# Patient Record
Sex: Male | Born: 1945 | Race: White | Hispanic: No | Marital: Married
Health system: Midwestern US, Community
[De-identification: ages and names within clinical notes are randomized; demographics above are authoritative.]

---

## 2014-03-26 IMAGING — DX ESOPHAGRAM
5 series · 5 of 5 positions shown · non-contrast
Comparison: None

ESOPHAGRAM 03/26/2014 [DATE]:
CLINICAL INDICATION: Nausea and epigastric fullness x4 weeks

[AP (1 of 3)]
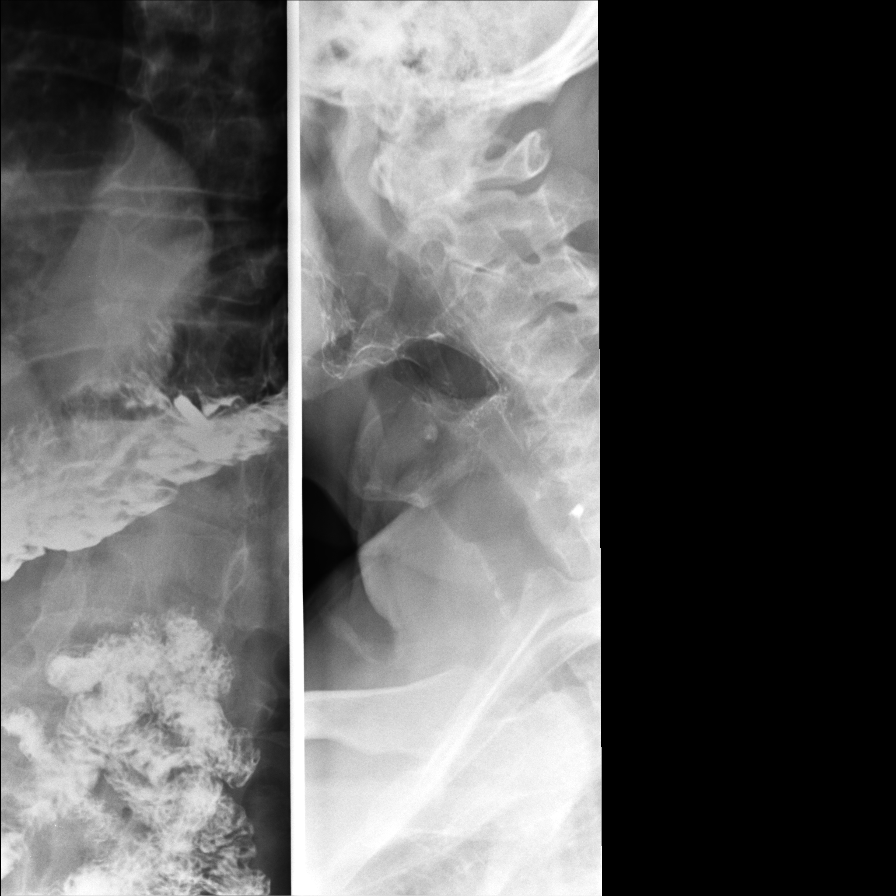

[AP (2 of 3)]
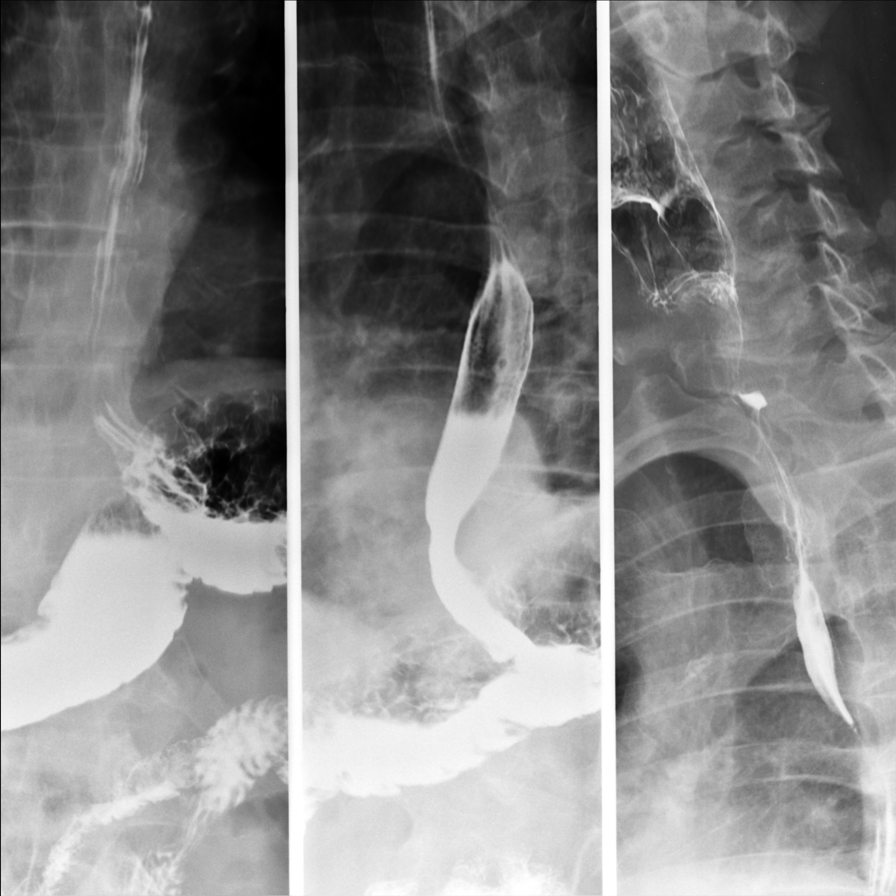

[rao]
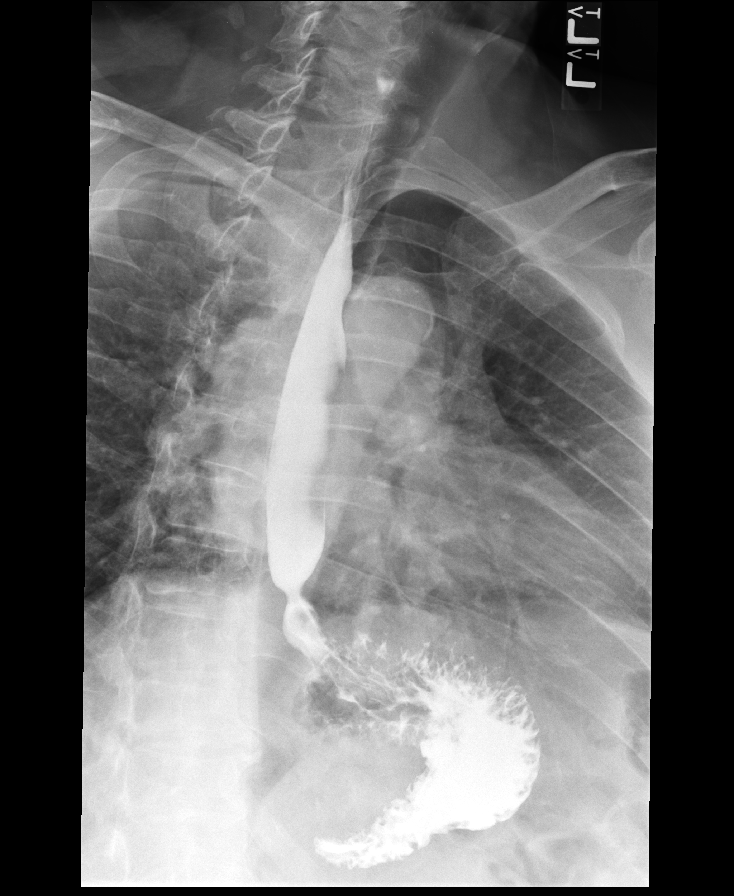

[AP (3 of 3)]
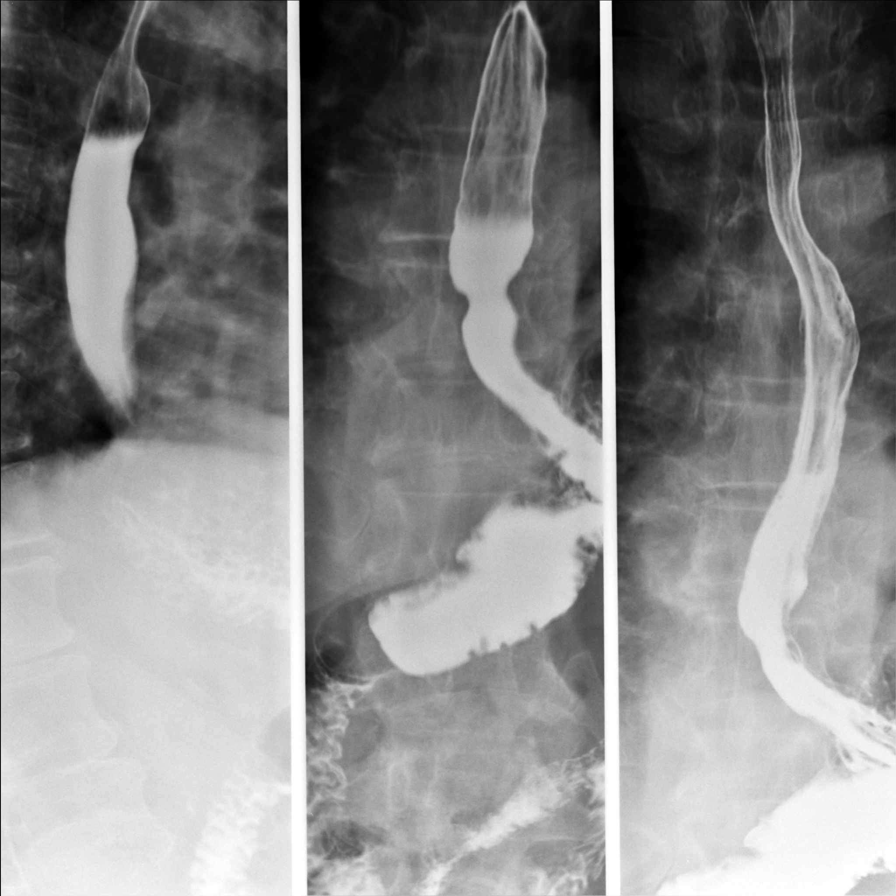

[lao]
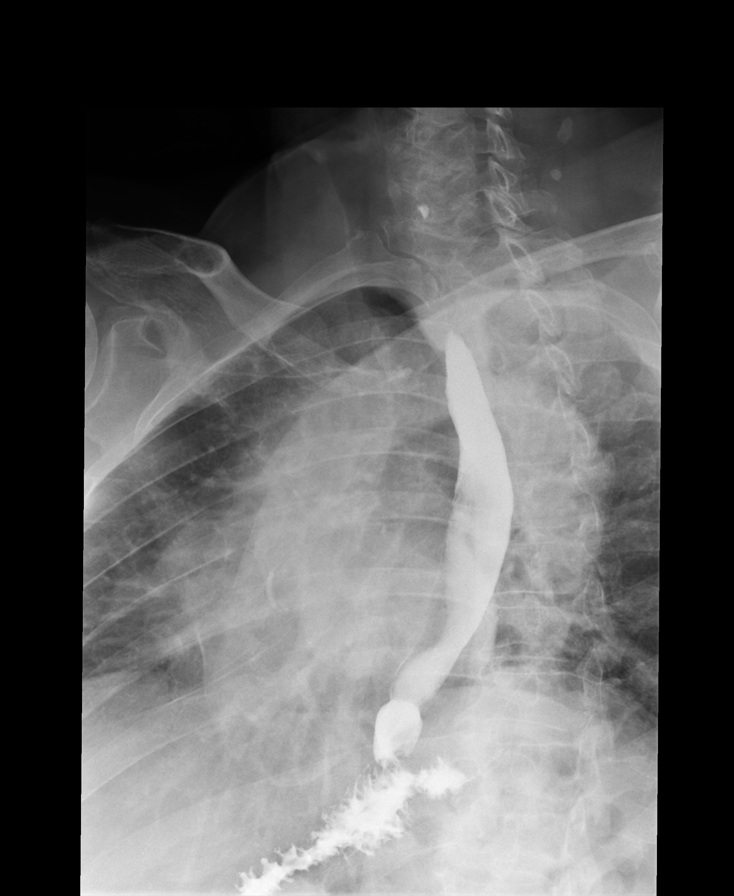

[5 of 5 positions shown; findings below may reference images not displayed]

FINDINGS: The patient drink thin and thick barium without difficulty, also 13
mm
barium tablet. There is no evidence for esophageal mass, stricture or
ulceration. There is no delay in initiating swallowing. There is a small
hiatal
hernia. No reflux elicited. There appears to be a tiny Jim diverticulum.
This measures less than 1 cm.
IMPRESSION: No evidence for stricture, mass or significant dysperistalsis.
Tiny Jim diverticulum, less than 1 cm.
Small hiatal hernia.

## 2014-03-26 IMAGING — DX CHEST PA AND LATERAL
2 series · 2 of 2 positions shown · non-contrast
Comparison: none

CLINICAL INDICATION:  Nausea and epigastric fullness x4 weeks
TECHNIQUE: PA and lateral radiographs of the chest.

[lateral]
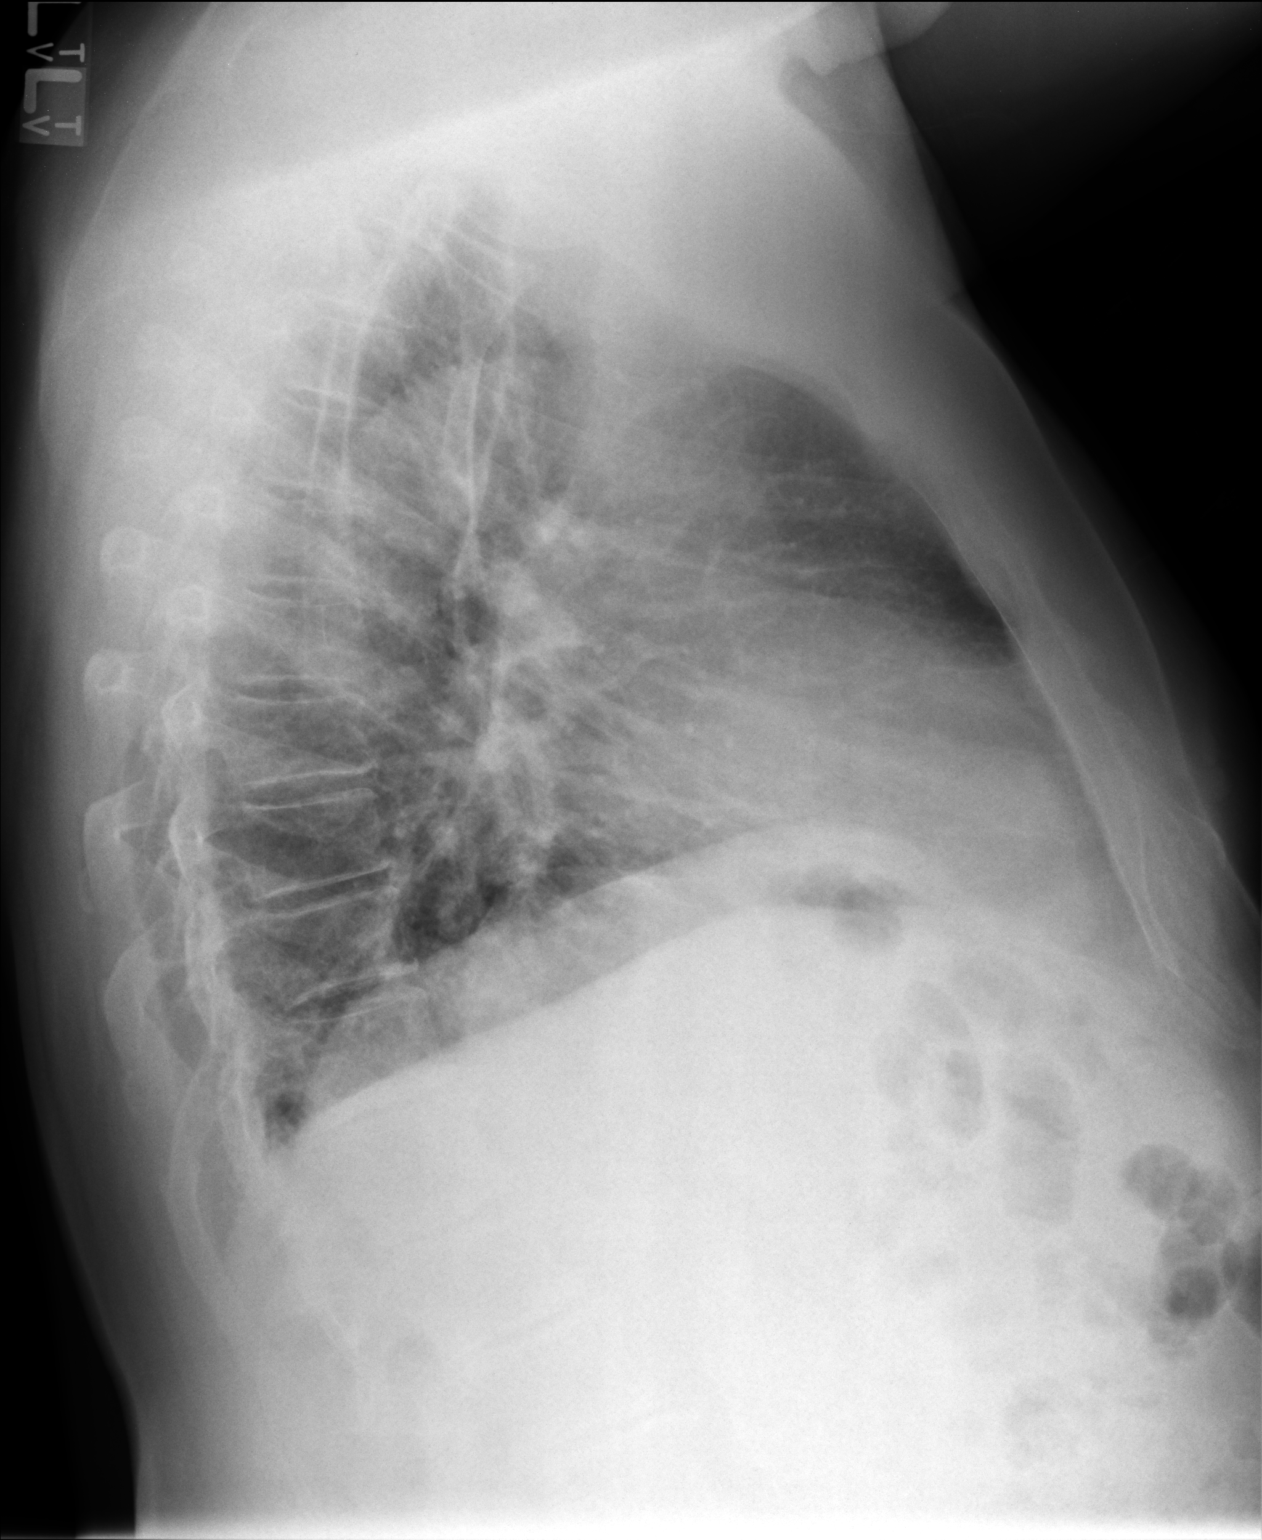

[PA]
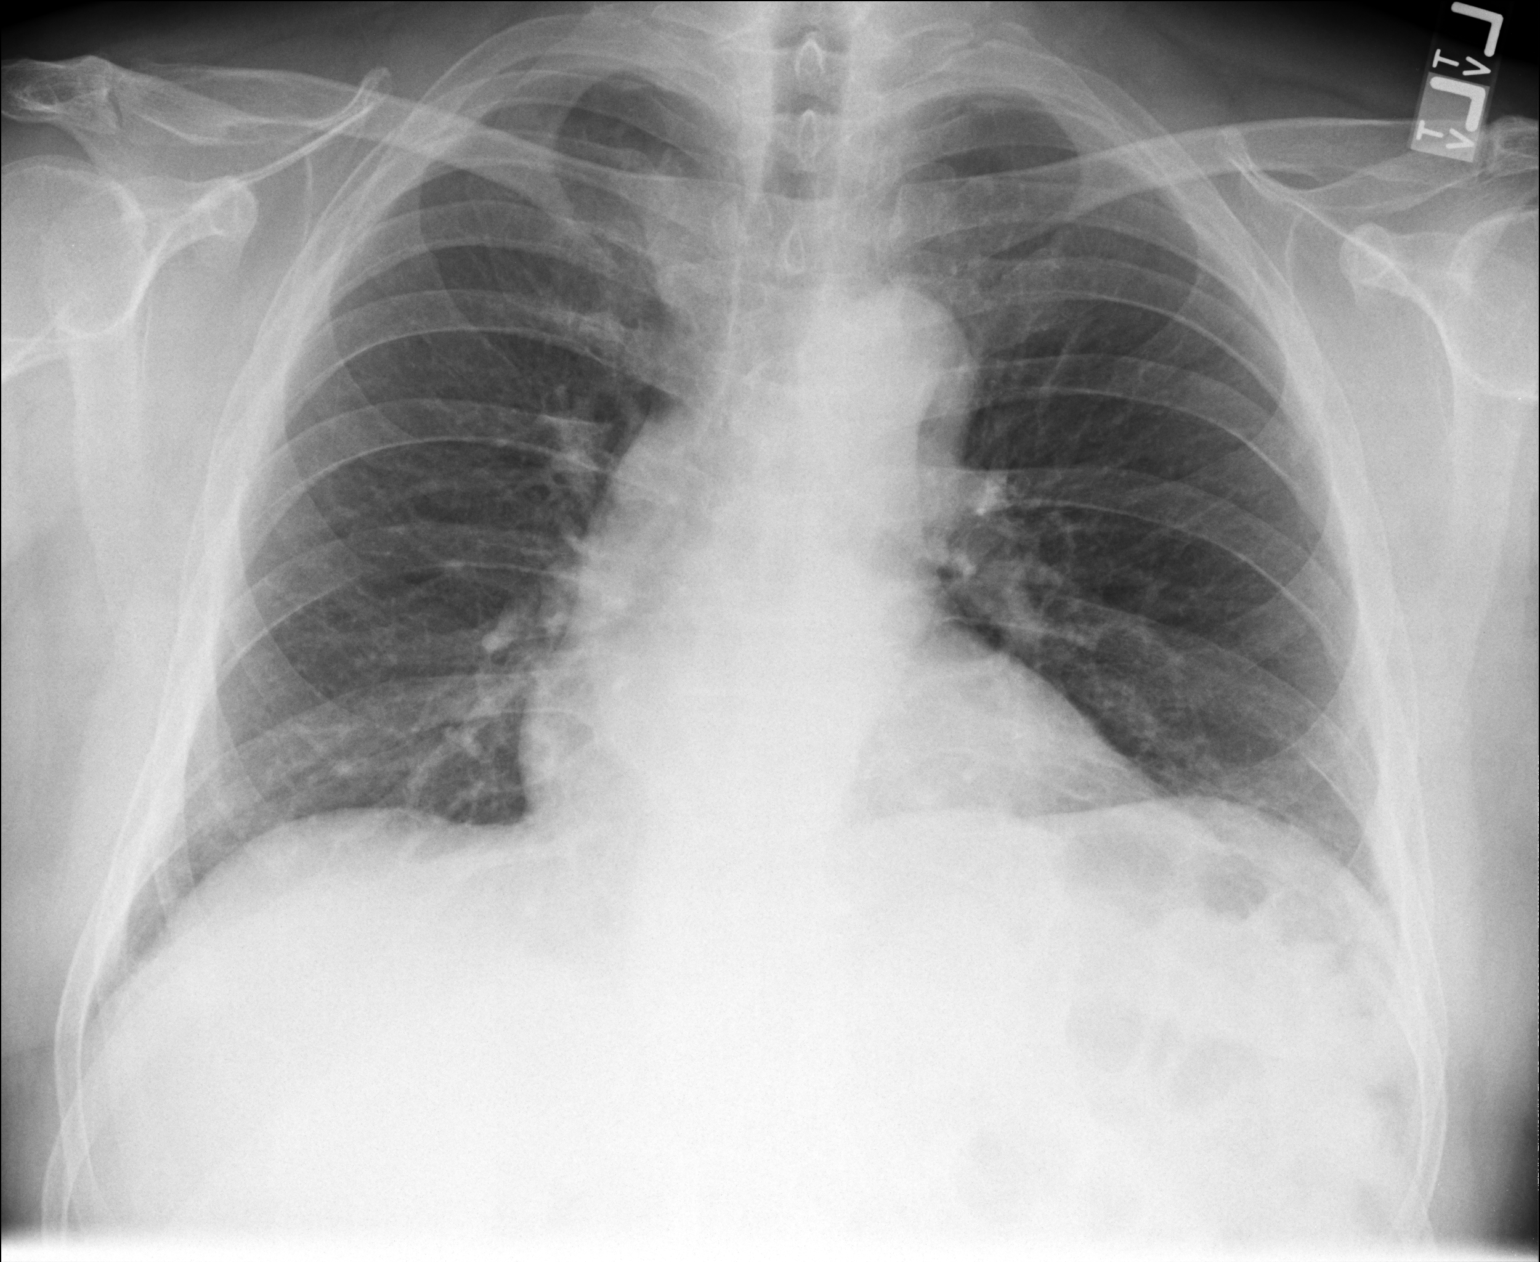

[2 of 2 positions shown; findings below may reference images not displayed]

FINDINGS: The lungs are well-expanded. There is minor atelectasis/fibrosis at
the left base. There is no effusion. The heart is not enlarged. The aorta is
tortuous. There are degenerative changes. There is no evidence for pulmonary
nodule.
IMPRESSION: Mild atelectasis/fibrosis at the left base. Otherwise no active
cardiopulmonary
findings.

## 2014-08-02 ENCOUNTER — Inpatient Hospital Stay: Admit: 2014-08-02

## 2014-08-02 LAB — CBC WITH AUTO DIFFERENTIAL
Absolute Eos #: 0.1 10*3/uL (ref 0.0–0.4)
Absolute Lymph #: 1.7 10*3/uL (ref 1.0–4.8)
Absolute Mono #: 0.4 10*3/uL (ref 0.2–0.8)
Basophils Absolute: 0 10*3/uL (ref 0.0–0.2)
Basophils: 1 % (ref 0–2)
Eosinophils %: 3 % (ref 1–4)
Hematocrit: 39.4 % — ABNORMAL LOW (ref 41–53)
Hemoglobin: 13.1 g/dL — ABNORMAL LOW (ref 13.5–17.5)
Lymphocytes: 36 % (ref 24–44)
MCH: 31.3 pg (ref 26–34)
MCHC: 33.3 g/dL (ref 31–37)
MCV: 93.8 fL (ref 80–100)
MPV: 9.4 fL (ref 6.0–12.0)
Monocytes: 8 % — ABNORMAL HIGH (ref 1–7)
Platelets: 149 10*3/uL (ref 130–400)
RBC: 4.2 m/uL — ABNORMAL LOW (ref 4.5–5.9)
RDW: 12.5 % (ref 11.5–14.5)
Seg Neutrophils: 52 % (ref 36–66)
Segs Absolute: 2.5 10*3/uL (ref 1.8–7.7)
WBC: 4.7 10*3/uL (ref 3.5–11.0)

## 2014-08-02 LAB — BASIC METABOLIC PANEL
Anion Gap: 12 mmol/L (ref 9–17)
BUN: 23 mg/dL (ref 8–23)
Bun/Cre Ratio: 24 — ABNORMAL HIGH (ref 9–20)
CO2: 26 mmol/L (ref 20–31)
Calcium: 8.8 mg/dL (ref 8.6–10.4)
Chloride: 104 mmol/L (ref 98–107)
Creatinine: 0.95 mg/dL (ref 0.70–1.20)
GFR African American: >60 mL/min (ref >60)
GFR Non-African American: >60 mL/min (ref >60)
Glucose: 125 mg/dL — ABNORMAL HIGH (ref 70–99)
Potassium: 4.4 mmol/L (ref 3.7–5.3)
Sodium: 142 mmol/L (ref 135–144)

## 2014-08-02 LAB — TYPE AND SCREEN
ABO/Rh: A POS
Antibody Screen: NEGATIVE

## 2014-08-02 NOTE — H&P (Signed)
History and Physical    Pt Name: Joshua Dorsey  MRN: [id number]  Birth date: [birth year]  Date of evaluation: 08/02/2014  Primary Care Physician: Otilio Saber, MD    SUBJECTIVE:   History of Chief Complaint:    This is Joshua Dorsey a 69 y.o. male who presents today with neck discomfort that radiates down right arm and left shoulder. + Numbness with deep aches, 6/10 on pain scale. Aggravating factors Prolonged walking/standing and sudden movements. Alleviating Factors denied. 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 Will be presenting for C 5 - C 6  C 6 - C 7 ACDF.     Past Medical History    has a past medical history of Arthritis; Claustrophobia; Depression; Diabetes mellitus (HCC); GERD (gastroesophageal reflux disease); Hyperlipidemia; Hypertension; OSA on CPAP; PTSD (post-traumatic stress disorder); and RLS (restless legs syndrome).     Past Surgical History   has a past surgical history that includes Cholecystectomy; back surgery (2009); and Tonsillectomy.     Medications  Prior to Admission medications    Medication Sig Start Date End Date Taking? Authorizing Provider   omeprazole (PRILOSEC) 20 MG capsule Take 20 mg by mouth Daily   Yes Historical Provider, MD   buPROPion (WELLBUTRIN) 100 MG tablet Take 200 mg by mouth daily   Yes Historical Provider, MD   hydrochlorothiazide (HYDRODIURIL) 25 MG tablet Take 25 mg by mouth every other day   Yes Historical Provider, MD   metoprolol (LOPRESSOR) 25 MG tablet Take 12.5 mg by mouth 2 times daily   Yes Historical Provider, MD   losartan (COZAAR) 100 MG tablet Take 100 mg by mouth daily   Yes Historical Provider, MD   citalopram (CELEXA) 20 MG tablet Take 20 mg by mouth daily   Yes Historical Provider, MD   atorvastatin (LIPITOR) 20 MG tablet Take 20 mg by mouth daily   Yes Historical Provider, MD   tamsulosin (FLOMAX) 0.4 MG capsule Take 0.4 mg by mouth daily   Yes Historical Provider, MD   fluticasone (FLONASE) 50 MCG/ACT nasal spray 2 sprays by Nasal route  daily   Yes Historical Provider, MD   prazosin (MINIPRESS) 2 MG capsule Take 2 mg by mouth nightly   Yes Historical Provider, MD   traZODone (DESYREL) 150 MG tablet Take 150 mg by mouth nightly   Yes Historical Provider, MD   loratadine (CLARITIN) 10 MG tablet Take 10 mg by mouth daily   Yes Historical Provider, MD   meloxicam (MOBIC) 15 MG tablet Take 15 mg by mouth daily   Yes Historical Provider, MD   metFORMIN (GLUCOPHAGE) 500 MG tablet Take 250 mg by mouth daily (with breakfast)   Yes Historical Provider, MD   Probiotic Product (PROBIOTIC COLON SUPPORT PO) Take by mouth daily   Yes Historical Provider, MD   iron polysaccaride (FERREX 150 FORTE PLUS) 50-100 MG CAPS Take 1 capsule by mouth daily   Yes Historical Provider, MD   ascorbic acid (VITAMIN C) 500 MG tablet Take 500 mg by mouth daily RLS   Yes Historical Provider, MD   Acetaminophen-Codeine (TYLENOL WITH CODEINE #3 PO) Take 1 tablet by mouth every 6 hours as needed   Yes Historical Provider, MD     Allergies  is allergic to oxycontin [oxycodone hcl].  Family History  History reviewed. No pertinent family history.   Social History  Social History     Social History   ??? Marital status: Married  Spouse name: N/A   ??? Number of children: N/A   ??? Years of education: N/A     Occupational History   ??? Not on file.     Social History Main Topics   ??? Smoking status: Former Smoker     Quit date: 08/02/1986   ??? Smokeless tobacco: Not on file   ??? Alcohol use 1.2 oz/week     2 Cans of beer per week   ??? Drug use: No   ??? Sexual activity: Not on file     Other Topics Concern   ??? Not on file     Social History Narrative   ??? No narrative on file       ROS: Pertinent findings in the HPI above.  A comprehensive review of systems was Negative. No LMP for male patient.  No obstetric history on file.    OBJECTIVE:   VITALS:  height is  (1.702 m) and weight is 200 lb 2.8 oz (90.8 kg). His blood pressure is 138/65 and his pulse is 56. His respiration is 16 and oxygen  saturation is 95%.     CONSTITUTIONAL:This is a 69 y.o. male who is cooperative, pleasant, alert & orientated x 3, and in no acute distress   SKIN:  Pink, warm and dry, no rashes   HEAD:  Normocephalic, atraumatic, face symmetrical  EYES: PERRL.  EOMs intact.  Cranial nerves II-XII grossly intact  EARS:  Hearing grossly WNL.    NOSE:  Nares patent.  No rhinorrhea   THROAT: Airway is patent, membranes are moist   NECK: Supple, trachea midline, no lymphadenopathy, no JVD, no bruits  LUNGS: Equal chest expansion and clear to auscultation bilaterally revealing no wheezes, rales or rhonchi. No use of accessory muscles.  CARDIOVASCULAR: Heart sounds are normal.  Regular rate and rhythm without murmur, gallop or rub.  ABDOMEN: Generally soft, nontender, nondistended, no masses or organomegaly, no hernias palpable, no guarding or peritoneal signs. Bowel sounds are present in all four quadrants.   EXTREMITIES: No upper/lower extremity cyanosis, clubbing or edema bilaterally, Intact distal pulses 3+/3+, no calf tenderness, Homans sign negative, strength 5+/5+. Body Position Antalgic. Mobility assisted with cane.    Testing:    EKG: 08/02/2014  Labs: No results found for: HGB, HCT, WBC, PLATELET, NA, K, CL, CO2, BUN, CREATININE, GLUCOSE, CALCIUM, AST, ALT, BILITOT, BILIDIR, AMMONIA, CRP, CKTOTAL, CKMB, HCG, HCGQUANT, PREGSERUM, PTT, INR    CBC: No results for input(s): WBC, HGB, PLT, SEGSPCT, SEGSABS, BANDSPCT, BLASTSPCT, LYMPHOPCT, EOSPCT, SEGSABS, MONOSABS, LYMPHSABS, EOSABS, BASOSABS, DIFFTYPE in the last 72 hours.    Invalid input(s): HCT3    BMP:  No results for input(s): NA, K, CL, CO2, BUN, CREATININE, GLUCOSE in the last 72 hours.    Invalid input(s): IBEGFR, IINBEGFR  Calcium:No results for input(s): CALCIUM in the last 72 hours.  Ionized Calcium:No results for input(s): IONCA in the last 72 hours.  Magnesium:No results for input(s): MG in the last 72 hours.  Phosphorus:No results for input(s): PHOS in the last 72  hours.  BNP:No results for input(s): BNP in the last 72 hours.  Glucose:No results for input(s): POCGLU in the last 72 hours.  HgbA1C: No results for input(s): LABA1C in the last 72 hours.  INR: No results for input(s): INR in the last 72 hours.  Hepatic: No results for input(s): ALKPHOS, ALT, AST, PROT, BILITOT, BILIDIR, LABALBU in the last 72 hours.  Amylase and Lipase:No results for input(s): LACTA, AMYLASE in the last  72 hours.  Lactic Acid: No results for input(s): LACTA in the last 72 hours.  Cardiac Enzymes:No results for input(s): CKTOTAL, CKMB, CKMBINDEX, TROPONINI in the last 72 hours.  BNP: No results for input(s): BNP in the last 72 hours.  Lipids: No results for input(s): CHOL, TRIG, HDL, LDLCALC in the last 72 hours.    Invalid input(s): LDL  ABGs: No results found for: PH, PCO2, PO2, HCO3, O2SAT  Thyroid: No results found for: TSH   Urinalysis:   No results for input(s): CLARITYU, COLORU, PHUR, SPECGRAV, PROTEINU, RBCUA, BLOODU, BACTERIA, NITRU, WBCUA, LEUKOCYTESUR, YEAST, GLUCOSEU, BILIRUBINUR in the last 72 hours.    IMPRESSIONS:   1.  C 5 - C 6  C 6 - C 7 Cervical Stenosis  2.  has a past medical history of Arthritis; Claustrophobia; Depression; Diabetes mellitus (HCC); GERD (gastroesophageal reflux disease); Hyperlipidemia; Hypertension; OSA on CPAP; PTSD (post-traumatic stress disorder); and RLS (restless legs syndrome).   PLANS:           C 5 - C 6  C 6 - C 7 ACDF       Isaia Hassell APRN, ANP-BC  Electronically signed 08/02/2014 at 9:50 AM

## 2014-08-02 NOTE — Patient Instructions (Signed)
PRE-OP INSTRUCTIONS      Date and time of surgery____1100___________________________________    Arrival time____________0900_______faq sheet and hibiclense with instructions given    Stop mobic   1. DO NOT EAT or DRINK ANYTHING after midnight the night before or morning of  surgery. This includes no water, chewing gum or mints.   2. Take the following pills with a small sip of water on the morning of surgery__metorprolol,omeprazole,bupropion,Tylenol #3,citalopram,losartan___________________________________________________   3. Aspirin, Ibuprofen, Advil, Vitamin E and other Anti-inflammatory products should be stopped for 5 days before surgery or as directed by your physician.   4. Check with your Doctor regarding stopping Plavix, Coumadin, or other blood thinners.              5. DO NOT inject insulin or take diabetic medications morning of surgery unless instructed per doctor.   6. DO NOT smoke, and do not drink any alcoholic beverages 24 hours prior to surgery.   7. You MUST make arrangements for a responsible adult to take you home after your surgery. You will not be allowed to leave alone or drive yourself home.  It is strongly suggested someone stay with you the first 24 hrs. Your surgery will be cancelled if you do not have a ride home.   8. Please wear simple, loose fitting clothing to the hospital.  Do not bring valuables (money, credit cards, checkbooks, etc.) Do not wear any makeup (including no eye makeup) or nail polish on your fingers or toes of operative extremity.   9. DO NOT wear any jewelry or piercings on day of surgery.  All body piercing jewelry must be removed.   10. If you have dentures, they will be removed before going to the OR; we will provide you a container.  If you wear contact lenses or glasses, they will be removed; please bring a case for them.   11. Remember to bring Blood Band bracelet to the hospital on the day of surgery if you received it in PAT.   12. Notify your Surgeon if  you develop any illness between now and surgery  time, cough, cold, fever, sore throat, nausea, vomiting, etc.  Please notify your surgeon if you experience dizziness, shortness of breath or blurred vision between now & the time of your surgery.      Other________________________________________________________________      *Please call Pre Admission Testing if you have any further questions.    [phone], Monday through Friday, 7:00am to 5:00pm.              I acknowledge receiving instructions as indicated above.                                                                            Patient/Rep_____________________________________________

## 2014-08-03 LAB — MRSA DNA PROBE, NASAL

## 2014-08-03 LAB — EKG 12-LEAD
Atrial Rate: 51 {beats}/min
P Axis: 61 degrees
P-R Interval: 162 ms
Q-T Interval: 442 ms
QRS Duration: 104 ms
QTc Calculation (Bazett): 407 ms
R Axis: 17 degrees
T Axis: 35 degrees
Ventricular Rate: 51 {beats}/min

## 2014-08-11 MED FILL — CEFAZOLIN SODIUM-DEXTROSE 2-4 GM/100ML-% IV SOLN: 2-4 GM/100ML-% | INTRAVENOUS | Qty: 2

## 2014-08-12 ENCOUNTER — Inpatient Hospital Stay: Admit: 2014-08-12

## 2014-08-12 ENCOUNTER — Encounter: Admit: 2014-08-12 | Discharge: 2014-08-13 | Disposition: A | Discharge status: Home / Self Care

## 2014-08-12 ENCOUNTER — Inpatient Hospital Stay: Admit: 2014-08-12 | Discharge: 2014-09-09 | Payer: MEDICARE

## 2014-08-12 LAB — POC GLUCOSE FINGERSTICK: POC Glucose: 103 mg/dL (ref 75–110)

## 2014-08-12 MED ORDER — TRAZODONE HCL 50 MG PO TABS
50 MG | Freq: Every evening | ORAL | Status: DC
Start: 2014-08-12 — End: 2014-08-13
  Administered 2014-08-13: 02:00:00 150 mg via ORAL

## 2014-08-12 MED ORDER — HYDROCHLOROTHIAZIDE 25 MG PO TABS
25 MG | ORAL | Status: DC
Start: 2014-08-12 — End: 2014-08-12

## 2014-08-12 MED ORDER — DOCUSATE SODIUM 100 MG PO CAPS
100 MG | Freq: Two times a day (BID) | ORAL | Status: DC
Start: 2014-08-12 — End: 2014-08-13
  Administered 2014-08-13 (×2): 100 mg via ORAL

## 2014-08-12 MED ORDER — OXYCODONE-ACETAMINOPHEN 5-325 MG PO TABS
5-325 MG | ORAL | Status: DC | PRN
Start: 2014-08-12 — End: 2014-08-13

## 2014-08-12 MED ORDER — LOSARTAN POTASSIUM 100 MG PO TABS
100 MG | Freq: Every day | ORAL | Status: DC
Start: 2014-08-12 — End: 2014-08-13
  Administered 2014-08-13: 12:00:00 100 mg via ORAL

## 2014-08-12 MED ORDER — NORMAL SALINE FLUSH 0.9 % IV SOLN
0.9 % | Freq: Two times a day (BID) | INTRAVENOUS | Status: DC
Start: 2014-08-12 — End: 2014-08-13

## 2014-08-12 MED ORDER — CITALOPRAM HYDROBROMIDE 20 MG PO TABS
20 MG | Freq: Every day | ORAL | Status: DC
Start: 2014-08-12 — End: 2014-08-13
  Administered 2014-08-13: 12:00:00 20 mg via ORAL

## 2014-08-12 MED ORDER — MEPERIDINE HCL 25 MG/ML IJ SOLN
25 MG/ML | INTRAMUSCULAR | Status: DC | PRN
Start: 2014-08-12 — End: 2014-08-12

## 2014-08-12 MED ORDER — DEXAMETHASONE SODIUM PHOSPHATE 10 MG/ML IJ SOLN
10 MG/ML | INTRAMUSCULAR | Status: AC
Start: 2014-08-12 — End: ?

## 2014-08-12 MED ORDER — PROPOFOL 200 MG/20ML IV EMUL
200 MG/20ML | INTRAVENOUS | Status: AC
Start: 2014-08-12 — End: ?

## 2014-08-12 MED ORDER — HYDROMORPHONE HCL 2 MG/ML IJ SOLN
2 MG/ML | INTRAMUSCULAR | Status: DC | PRN
Start: 2014-08-12 — End: 2014-08-12

## 2014-08-12 MED ORDER — LIDOCAINE-EPINEPHRINE 0.5 %-1:200000 IJ SOLN
0.5 %-1:200000 | INTRAMUSCULAR | Status: AC
Start: 2014-08-12 — End: ?

## 2014-08-12 MED ORDER — LACTATED RINGERS IV SOLN
INTRAVENOUS | Status: DC
Start: 2014-08-12 — End: 2014-08-13
  Administered 2014-08-13 (×2): via INTRAVENOUS

## 2014-08-12 MED ORDER — ONDANSETRON HCL 4 MG/2ML IJ SOLN
4 MG/2ML | Freq: Once | INTRAMUSCULAR | Status: DC | PRN
Start: 2014-08-12 — End: 2014-08-12

## 2014-08-12 MED ORDER — FERREX 150 FORTE PLUS 50-100 MG PO CAPS
50-100 MG | Freq: Every day | ORAL | Status: DC
Start: 2014-08-12 — End: 2014-08-13
  Administered 2014-08-13: 02:00:00 1 via ORAL

## 2014-08-12 MED ORDER — METHYLENE BLUE 1 % IJ SOLN
1 % | INTRAMUSCULAR | Status: AC
Start: 2014-08-12 — End: ?

## 2014-08-12 MED ORDER — METOPROLOL TARTRATE 25 MG PO TABS
25 MG | Freq: Two times a day (BID) | ORAL | Status: DC
Start: 2014-08-12 — End: 2014-08-13
  Administered 2014-08-13 (×2): 12.5 mg via ORAL

## 2014-08-12 MED ORDER — GENTAMICIN SULFATE 0.3 % OP OINT
0.3 % | Freq: Two times a day (BID) | OPHTHALMIC | Status: DC
Start: 2014-08-12 — End: 2014-08-13
  Administered 2014-08-12: 21:00:00 0.5 [in_us] via OPHTHALMIC

## 2014-08-12 MED ORDER — ATORVASTATIN CALCIUM 20 MG PO TABS
20 MG | Freq: Every evening | ORAL | Status: DC
Start: 2014-08-12 — End: 2014-08-13
  Administered 2014-08-13: 02:00:00 20 mg via ORAL

## 2014-08-12 MED ORDER — HYDRALAZINE HCL 20 MG/ML IJ SOLN
20 MG/ML | INTRAMUSCULAR | Status: DC | PRN
Start: 2014-08-12 — End: 2014-08-12

## 2014-08-12 MED ORDER — METFORMIN HCL 500 MG PO TABS
500 MG | Freq: Every day | ORAL | Status: DC
Start: 2014-08-12 — End: 2014-08-13
  Administered 2014-08-13: 12:00:00 250 mg via ORAL

## 2014-08-12 MED ORDER — TAMSULOSIN HCL 0.4 MG PO CAPS
0.4 MG | Freq: Every day | ORAL | Status: DC
Start: 2014-08-12 — End: 2014-08-13
  Administered 2014-08-13: 02:00:00 0.4 mg via ORAL

## 2014-08-12 MED ORDER — GLYCOPYRROLATE 1 MG/5ML IJ SOLN
1 MG/5ML | INTRAMUSCULAR | Status: AC
Start: 2014-08-12 — End: ?

## 2014-08-12 MED ORDER — PHENYLEPHRINE HCL 10 MG/ML IJ SOLN
10 MG/ML | INTRAMUSCULAR | Status: AC
Start: 2014-08-12 — End: ?

## 2014-08-12 MED ORDER — FENTANYL CITRATE (PF) 100 MCG/2ML IJ SOLN
100 MCG/2ML | INTRAMUSCULAR | Status: DC | PRN
Start: 2014-08-12 — End: 2014-08-12
  Administered 2014-08-12: 19:00:00 25 ug via INTRAVENOUS

## 2014-08-12 MED ORDER — MAGNESIUM HYDROXIDE 400 MG/5ML PO SUSP
400 MG/5ML | Freq: Every day | ORAL | Status: DC | PRN
Start: 2014-08-12 — End: 2014-08-13

## 2014-08-12 MED ORDER — DIPHENHYDRAMINE HCL 50 MG/ML IJ SOLN
50 MG/ML | Freq: Once | INTRAMUSCULAR | Status: DC | PRN
Start: 2014-08-12 — End: 2014-08-12

## 2014-08-12 MED ORDER — PRAZOSIN HCL 1 MG PO CAPS
1 MG | Freq: Every evening | ORAL | Status: DC
Start: 2014-08-12 — End: 2014-08-13

## 2014-08-12 MED ORDER — SODIUM CHLORIDE BACTERIOSTATIC 0.9 % IJ SOLN
0.9 % | INTRAMUSCULAR | Status: AC
Start: 2014-08-12 — End: ?

## 2014-08-12 MED ORDER — CETIRIZINE HCL 10 MG PO TABS
10 MG | Freq: Every day | ORAL | Status: DC
Start: 2014-08-12 — End: 2014-08-13
  Administered 2014-08-13: 12:00:00 10 mg via ORAL

## 2014-08-12 MED ORDER — FLUTICASONE PROPIONATE 50 MCG/ACT NA SUSP
50 MCG/ACT | Freq: Every day | NASAL | Status: DC
Start: 2014-08-12 — End: 2014-08-13
  Administered 2014-08-13: 02:00:00 2 via NASAL

## 2014-08-12 MED ORDER — ONDANSETRON HCL 4 MG/2ML IJ SOLN
4 MG/2ML | Freq: Four times a day (QID) | INTRAMUSCULAR | Status: DC | PRN
Start: 2014-08-12 — End: 2014-08-13

## 2014-08-12 MED ORDER — DEXAMETHASONE SODIUM PHOSPHATE 10 MG/ML IJ SOLN
10 MG/ML | Freq: Once | INTRAMUSCULAR | Status: DC | PRN
Start: 2014-08-12 — End: 2014-08-12

## 2014-08-12 MED ORDER — BUPROPION HCL 100 MG PO TABS
100 MG | Freq: Every day | ORAL | Status: DC
Start: 2014-08-12 — End: 2014-08-13
  Administered 2014-08-13: 12:00:00 200 mg via ORAL

## 2014-08-12 MED ORDER — HYDROCHLOROTHIAZIDE 25 MG PO TABS
25 MG | ORAL | Status: DC
Start: 2014-08-12 — End: 2014-08-13
  Administered 2014-08-13: 12:00:00 25 mg via ORAL

## 2014-08-12 MED ORDER — NORMAL SALINE FLUSH 0.9 % IV SOLN
0.9 % | INTRAVENOUS | Status: DC | PRN
Start: 2014-08-12 — End: 2014-08-13

## 2014-08-12 MED ORDER — MORPHINE SULFATE (PF) 2 MG/ML IV SOLN
2 MG/ML | INTRAVENOUS | Status: DC | PRN
Start: 2014-08-12 — End: 2014-08-13

## 2014-08-12 MED ORDER — OXYCODONE-ACETAMINOPHEN 5-325 MG PO TABS
5-325 MG | ORAL | Status: DC | PRN
Start: 2014-08-12 — End: 2014-08-13
  Administered 2014-08-12 – 2014-08-13 (×3): 2 via ORAL

## 2014-08-12 MED ORDER — LACTATED RINGERS IV SOLN
INTRAVENOUS | Status: DC
Start: 2014-08-12 — End: 2014-08-12
  Administered 2014-08-12 (×2): via INTRAVENOUS

## 2014-08-12 MED ORDER — LIDOCAINE HCL (PF) 2 % IJ SOLN
2 % | INTRAMUSCULAR | Status: AC
Start: 2014-08-12 — End: ?

## 2014-08-12 MED ORDER — MORPHINE SULFATE (PF) 4 MG/ML IV SOLN
4 MG/ML | INTRAVENOUS | Status: DC | PRN
Start: 2014-08-12 — End: 2014-08-13
  Administered 2014-08-13: 4 mg via INTRAVENOUS

## 2014-08-12 MED ORDER — CEFAZOLIN SODIUM-DEXTROSE 2-4 GM/100ML-% IV SOLN
2-4 GM/100ML-% | Freq: Once | INTRAVENOUS | Status: AC
Start: 2014-08-12 — End: 2014-08-12
  Administered 2014-08-12: 15:00:00 2 g via INTRAVENOUS

## 2014-08-12 MED ORDER — ONDANSETRON HCL 4 MG/2ML IJ SOLN
4 MG/2ML | INTRAMUSCULAR | Status: AC
Start: 2014-08-12 — End: ?

## 2014-08-12 MED ORDER — PANTOPRAZOLE SODIUM 40 MG PO TBEC
40 MG | Freq: Every day | ORAL | Status: DC
Start: 2014-08-12 — End: 2014-08-13
  Administered 2014-08-13: 10:00:00 40 mg via ORAL

## 2014-08-12 MED ORDER — NEOSTIGMINE METHYLSULFATE 1 MG/ML IJ SOLN
1 MG/ML | INTRAMUSCULAR | Status: AC
Start: 2014-08-12 — End: ?

## 2014-08-12 MED ORDER — NORMAL SALINE FLUSH 0.9 % IV SOLN
0.9 % | INTRAVENOUS | Status: DC | PRN
Start: 2014-08-12 — End: 2014-08-12

## 2014-08-12 MED ORDER — LABETALOL HCL 5 MG/ML IV SOLN
5 MG/ML | INTRAVENOUS | Status: DC | PRN
Start: 2014-08-12 — End: 2014-08-12

## 2014-08-12 MED ORDER — LIDOCAINE HCL (PF) 1 % IJ SOLN
1 % | Freq: Once | INTRAMUSCULAR | Status: DC | PRN
Start: 2014-08-12 — End: 2014-08-12

## 2014-08-12 MED ORDER — ACETAMINOPHEN 325 MG PO TABS
325 MG | ORAL | Status: DC | PRN
Start: 2014-08-12 — End: 2014-08-13

## 2014-08-12 MED ORDER — BACITRACIN 50000 UNITS IM SOLR
50000 units | INTRAMUSCULAR | Status: AC
Start: 2014-08-12 — End: ?

## 2014-08-12 MED ORDER — VITAMIN C 500 MG PO TABS
500 MG | Freq: Every evening | ORAL | Status: DC
Start: 2014-08-12 — End: 2014-08-13
  Administered 2014-08-13: 02:00:00 500 mg via ORAL

## 2014-08-12 MED FILL — SODIUM CHLORIDE BACTERIOSTATIC 0.9 % IJ SOLN: 0.9 % | INTRAMUSCULAR | Qty: 30

## 2014-08-12 MED FILL — BACITRACIN 50000 UNITS IM SOLR: 50000 units | INTRAMUSCULAR | Qty: 1

## 2014-08-12 MED FILL — GENTAK 0.3 % OP OINT: 0.3 % | OPHTHALMIC | Qty: 3.5

## 2014-08-12 MED FILL — FERREX 150 FORTE PLUS 50-100 MG PO CAPS: 50-100 MG | ORAL | Qty: 1

## 2014-08-12 MED FILL — BUPROPION HCL 100 MG PO TABS: 100 MG | ORAL | Qty: 2

## 2014-08-12 MED FILL — OXYCODONE-ACETAMINOPHEN 5-325 MG PO TABS: 5-325 MG | ORAL | Qty: 2

## 2014-08-12 MED FILL — FENTANYL CITRATE (PF) 100 MCG/2ML IJ SOLN: 100 MCG/2ML | INTRAMUSCULAR | Qty: 2

## 2014-08-12 MED FILL — TRAZODONE HCL 50 MG PO TABS: 50 MG | ORAL | Qty: 1

## 2014-08-12 MED FILL — ATORVASTATIN CALCIUM 20 MG PO TABS: 20 MG | ORAL | Qty: 1

## 2014-08-12 MED FILL — METHYLENE BLUE 1 % IJ SOLN: 1 % | INTRAMUSCULAR | Qty: 10

## 2014-08-12 MED FILL — PROPOFOL 200 MG/20ML IV EMUL: 200 MG/20ML | INTRAVENOUS | Qty: 20

## 2014-08-12 MED FILL — CETIRIZINE HCL 10 MG PO TABS: 10 MG | ORAL | Qty: 1

## 2014-08-12 MED FILL — GLYCOPYRROLATE 1 MG/5ML IJ SOLN: 1 MG/5ML | INTRAMUSCULAR | Qty: 5

## 2014-08-12 MED FILL — CITALOPRAM HYDROBROMIDE 20 MG PO TABS: 20 MG | ORAL | Qty: 1

## 2014-08-12 MED FILL — LIDOCAINE-EPINEPHRINE 0.5 %-1:200000 IJ SOLN: 0.5 %-1:200000 | INTRAMUSCULAR | Qty: 50

## 2014-08-12 MED FILL — NEOSTIGMINE METHYLSULFATE 1 MG/ML IJ SOLN: 1 MG/ML | INTRAMUSCULAR | Qty: 10

## 2014-08-12 MED FILL — ZEMURON 50 MG/5ML IV SOLN: 50 MG/5ML | INTRAVENOUS | Qty: 5

## 2014-08-12 MED FILL — FENTANYL CITRATE (PF) 250 MCG/5ML IJ SOLN: 250 MCG/5ML | INTRAMUSCULAR | Qty: 5

## 2014-08-12 MED FILL — ONDANSETRON HCL 4 MG/2ML IJ SOLN: 4 MG/2ML | INTRAMUSCULAR | Qty: 2

## 2014-08-12 MED FILL — FLUTICASONE PROPIONATE 50 MCG/ACT NA SUSP: 50 MCG/ACT | NASAL | Qty: 16

## 2014-08-12 MED FILL — PHENYLEPHRINE HCL 10 MG/ML IJ SOLN: 10 MG/ML | INTRAMUSCULAR | Qty: 1

## 2014-08-12 MED FILL — MIDAZOLAM HCL 2 MG/2ML IJ SOLN: 2 MG/ML | INTRAMUSCULAR | Qty: 2

## 2014-08-12 MED FILL — VITAMIN C 500 MG PO TABS: 500 MG | ORAL | Qty: 1

## 2014-08-12 MED FILL — DEXAMETHASONE SODIUM PHOSPHATE 10 MG/ML IJ SOLN: 10 MG/ML | INTRAMUSCULAR | Qty: 1

## 2014-08-12 MED FILL — EPHEDRINE SULFATE 50 MG/ML IJ SOLN: 50 MG/ML | INTRAMUSCULAR | Qty: 1

## 2014-08-12 MED FILL — PRAZOSIN HCL 1 MG PO CAPS: 1 MG | ORAL | Qty: 2

## 2014-08-12 MED FILL — MORPHINE SULFATE (PF) 4 MG/ML IV SOLN: 4 mg/mL | INTRAVENOUS | Qty: 1

## 2014-08-12 MED FILL — XYLOCAINE-MPF 2 % IJ SOLN: 2 % | INTRAMUSCULAR | Qty: 5

## 2014-08-12 NOTE — Other (Addendum)
Patient Acct Nbr:  [id number]  Primary AUTH/CERT:    Primary Insurance Company Name:   MEDICARE  Primary Insurance Plan Name:    Primary Insurance Group Number:    [facility]  Primary Insurance Policy Number:  [id number]    Secondary AUTH/CERT:    Secondary Insurance Company Name:   [facility]  Secondary Insurance Plan Name:    Secondary Insurance Group Number:    Secondary Insurance Plan Type: [employer]  Secondary Insurance Policy Number:  [id number]

## 2014-08-12 NOTE — Progress Notes (Addendum)
Called to see Pt re: c/o Scratchy , tearing left eye on return from PACU.Marland Kitchen.   Exam shows reddened, tearing left eye with some possible upper left conjunctival irritation.  No complaint of vision change.    Impression:  Minor conjunctival abrasion    Plan:  Patched eye with garamycin ointment.  Nurse may change dressing and apply ointment when dressing saturated.  Will keep in place until AM and will re-visit at that time.  Pt more comfortable now.

## 2014-08-12 NOTE — Anesthesia Post-Procedure Evaluation (Signed)
POST- ANESTHESIA EVALUATION       Pt Name: Joshua Dorsey  MRN: [id number]  Birthdate: 1945/08/18  Date of evaluation: 08/12/2014  Time:  3:28 PM      Visit Vitals   . BP 130/56   . Pulse 62   . Temp 96.8 F (36 C)   . Resp 12   . Ht 5\' 7"  (1.702 m)   . Wt 200 lb (90.7 kg)   . SpO2 92%   . BMI 31.32 kg/m2        Consciousness Level  Awake  Cardiopulmonary Status  Stable  Pain Adequately Treated YES  Nausea / Vomiting  NO  Adequate Hydration  YES  Anesthesia Related Complications NONE      Electronically signed by Trisha MangleMaamun X Koi Zangara, MD on 08/12/2014 at 3:28 PM    POST- ANESTHESIA EVALUATION       Pt Name: Joshua Dorsey  MRN: [id number]  Birthdate: 1945/08/18  Date of evaluation: 08/12/2014  Time:  3:28 PM      Visit Vitals   . BP 130/56   . Pulse 62   . Temp 96.8 F (36 C)   . Resp 12   . Ht 5\' 7"  (1.702 m)   . Wt 200 lb (90.7 kg)   . SpO2 92%   . BMI 31.32 kg/m2        Consciousness Level  Awake  Cardiopulmonary Status  Stable  Pain Adequately Treated YES  Nausea / Vomiting  NO  Adequate Hydration  YES  Anesthesia Related Complications NONE      Electronically signed by Trisha MangleMaamun X Salbador Fiveash, MD on 08/12/2014 at 3:28 PM

## 2014-08-12 NOTE — Plan of Care (Signed)
Problem: Falls - Risk of  Goal: Absence of falls  Outcome: Ongoing  Fall safety precautions in place

## 2014-08-12 NOTE — H&P (Signed)
History and Physical Update    Pt Name: Joshua Dorsey  MRN: [id number]  Birthdate: 1946/01/08  Date of evaluation: 08/12/2014       I have reviewed the H&P found in [facility] dated 08/01/2012 by Glendale Chard MSN CNP which meets the criteria for an Interval History and Physical note and is attached below.     I have examined  Joshua Dorsey and there are no changes to the patient or plans.    Vital signs:   Visit Vitals   ??? BP 133/64   ??? Pulse 51   ??? Temp 97.7 ??F (36.5 ??C) (Oral)   ??? Resp 18   ??? Ht  (1.702 m)   ??? Wt 200 lb (90.7 kg)   ??? SpO2 97%   ??? BMI 31.32 kg/m2       Allergies:  Oxycontin [oxycodone hcl]    Medications:   No current facility-administered medications on file prior to encounter.      Current Outpatient Prescriptions on File Prior to Encounter   Medication Sig Dispense Refill   ??? omeprazole (PRILOSEC) 20 MG capsule Take 20 mg by mouth Daily     ??? buPROPion (WELLBUTRIN) 100 MG tablet Take 200 mg by mouth daily     ??? hydrochlorothiazide (HYDRODIURIL) 25 MG tablet Take 25 mg by mouth every other day     ??? metoprolol (LOPRESSOR) 25 MG tablet Take 12.5 mg by mouth 2 times daily     ??? losartan (COZAAR) 100 MG tablet Take 100 mg by mouth daily     ??? citalopram (CELEXA) 20 MG tablet Take 20 mg by mouth daily     ??? atorvastatin (LIPITOR) 20 MG tablet Take 20 mg by mouth daily     ??? tamsulosin (FLOMAX) 0.4 MG capsule Take 0.4 mg by mouth daily     ??? fluticasone (FLONASE) 50 MCG/ACT nasal spray 2 sprays by Nasal route daily     ??? prazosin (MINIPRESS) 2 MG capsule Take 2 mg by mouth nightly     ??? traZODone (DESYREL) 150 MG tablet Take 150 mg by mouth nightly     ??? loratadine (CLARITIN) 10 MG tablet Take 10 mg by mouth daily     ??? meloxicam (MOBIC) 15 MG tablet Take 15 mg by mouth daily     ??? metFORMIN (GLUCOPHAGE) 500 MG tablet Take 250 mg by mouth daily (with breakfast)     ??? Probiotic Product (PROBIOTIC COLON SUPPORT PO) Take by mouth daily     ??? iron polysaccaride (FERREX 150 FORTE PLUS) 50-100 MG  CAPS Take 1 capsule by mouth daily     ??? ascorbic acid (VITAMIN C) 500 MG tablet Take 500 mg by mouth daily RLS     ??? Acetaminophen-Codeine (TYLENOL WITH CODEINE #3 PO) Take 1 tablet by mouth every 6 hours as needed              Prior to Admission medications    Medication Sig Start Date End Date Taking? Authorizing Provider   omeprazole (PRILOSEC) 20 MG capsule Take 20 mg by mouth Daily   Yes Historical Provider, MD   buPROPion (WELLBUTRIN) 100 MG tablet Take 200 mg by mouth daily   Yes Historical Provider, MD   hydrochlorothiazide (HYDRODIURIL) 25 MG tablet Take 25 mg by mouth every other day   Yes Historical Provider, MD   metoprolol (LOPRESSOR) 25 MG tablet Take 12.5 mg by mouth 2 times daily   Yes Historical Provider, MD   losartan (  COZAAR) 100 MG tablet Take 100 mg by mouth daily   Yes Historical Provider, MD   citalopram (CELEXA) 20 MG tablet Take 20 mg by mouth daily   Yes Historical Provider, MD   atorvastatin (LIPITOR) 20 MG tablet Take 20 mg by mouth daily   Yes Historical Provider, MD   tamsulosin (FLOMAX) 0.4 MG capsule Take 0.4 mg by mouth daily   Yes Historical Provider, MD   fluticasone (FLONASE) 50 MCG/ACT nasal spray 2 sprays by Nasal route daily   Yes Historical Provider, MD   prazosin (MINIPRESS) 2 MG capsule Take 2 mg by mouth nightly   Yes Historical Provider, MD   traZODone (DESYREL) 150 MG tablet Take 150 mg by mouth nightly   Yes Historical Provider, MD   loratadine (CLARITIN) 10 MG tablet Take 10 mg by mouth daily   Yes Historical Provider, MD   meloxicam (MOBIC) 15 MG tablet Take 15 mg by mouth daily   Yes Historical Provider, MD   metFORMIN (GLUCOPHAGE) 500 MG tablet Take 250 mg by mouth daily (with breakfast)   Yes Historical Provider, MD   Probiotic Product (PROBIOTIC COLON SUPPORT PO) Take by mouth daily   Yes Historical Provider, MD   iron polysaccaride (FERREX 150 FORTE PLUS) 50-100 MG CAPS Take 1 capsule by mouth daily   Yes Historical Provider, MD   ascorbic acid (VITAMIN C) 500 MG  tablet Take 500 mg by mouth daily RLS   Yes Historical Provider, MD   Acetaminophen-Codeine (TYLENOL WITH CODEINE #3 PO) Take 1 tablet by mouth every 6 hours as needed   Yes Historical Provider, MD       This is a 69 y.o. male who is pleasant, cooperative, alert and oriented x3, in no acute distress.    Heart: Heart sounds are normal.  Regular rate and rhythm without murmur, gallop or rub.   Lungs:clear to auscultation without wheezes or rales  No increased work of breathing, good air exchange, clear to auscultation bilaterally, no crackles or wheezing  Abdomen: soft, nontender, nondistended, no masses or organomegaly.   CONSTITUTIONAL:  awake, alert, cooperative, no apparent distress, and appears stated age    H&P  Date of Service: 08/02/2014 9:48 AM  Elberta Leatherwood, NP   Surgery   Cosigned by: Michiel Cowboy, MD at 08/09/2014 ??2:13 PM   Expand All Collapse All    [] Hide copied text  [] Hover for attribution information  History and Physical  ??  Pt Name: Joshua Dorsey  MRN: [id number]  Birth date: 1945-11-20  Date of evaluation: 08/02/2014  Primary Care Physician: Otilio Saber, MD  ??  SUBJECTIVE:   History of Chief Complaint:    This is [name] a 69 y.o. male who presents today with neck discomfort that radiates down right arm and left shoulder. + Numbness with deep aches, 6/10 on pain scale. Aggravating factors Prolonged walking/standing and sudden movements. Alleviating Factors denied. Dx C 5 - C 6 C 6 - C 7 Cervical Stenosis. Will be presenting for C 5 - C 6 C 6 - C 7 ACDF.   ??  Past Medical History    has a past medical history of Arthritis; Claustrophobia; Depression; Diabetes mellitus (HCC); GERD (gastroesophageal reflux disease); Hyperlipidemia; Hypertension; OSA on CPAP; PTSD (post-traumatic stress disorder); and RLS (restless legs syndrome).   ??  Past Surgical History   has a past surgical history that includes Cholecystectomy; back surgery (2009); and Tonsillectomy.   ??  Medications  Home??Medications            Prior to Admission medications    Medication Sig Start Date End Date Taking? Authorizing Provider   omeprazole (PRILOSEC) 20 MG capsule Take 20 mg by mouth Daily ?? ?? Yes Historical Provider, MD   buPROPion (WELLBUTRIN) 100 MG tablet Take 200 mg by mouth daily ?? ?? Yes Historical Provider, MD   hydrochlorothiazide (HYDRODIURIL) 25 MG tablet Take 25 mg by mouth every other day ?? ?? Yes Historical Provider, MD   metoprolol (LOPRESSOR) 25 MG tablet Take 12.5 mg by mouth 2 times daily ?? ?? Yes Historical Provider, MD   losartan (COZAAR) 100 MG tablet Take 100 mg by mouth daily ?? ?? Yes Historical Provider, MD   citalopram (CELEXA) 20 MG tablet Take 20 mg by mouth daily ?? ?? Yes Historical Provider, MD   atorvastatin (LIPITOR) 20 MG tablet Take 20 mg by mouth daily ?? ?? Yes Historical Provider, MD   tamsulosin (FLOMAX) 0.4 MG capsule Take 0.4 mg by mouth daily ?? ?? Yes Historical Provider, MD   fluticasone (FLONASE) 50 MCG/ACT nasal spray 2 sprays by Nasal route daily ?? ?? Yes Historical Provider, MD   prazosin (MINIPRESS) 2 MG capsule Take 2 mg by mouth nightly ?? ?? Yes Historical Provider, MD   traZODone (DESYREL) 150 MG tablet Take 150 mg by mouth nightly ?? ?? Yes Historical Provider, MD   loratadine (CLARITIN) 10 MG tablet Take 10 mg by mouth daily ?? ?? Yes Historical Provider, MD   meloxicam (MOBIC) 15 MG tablet Take 15 mg by mouth daily ?? ?? Yes Historical Provider, MD   metFORMIN (GLUCOPHAGE) 500 MG tablet Take 250 mg by mouth daily (with breakfast) ?? ?? Yes Historical Provider, MD   Probiotic Product (PROBIOTIC COLON SUPPORT PO) Take by mouth daily ?? ?? Yes Historical Provider, MD   iron polysaccaride (FERREX 150 FORTE PLUS) 50-100 MG CAPS Take 1 capsule by mouth daily ?? ?? Yes Historical Provider, MD   ascorbic acid (VITAMIN C) 500 MG tablet Take 500 mg by mouth daily RLS ?? ?? Yes Historical Provider, MD   Acetaminophen-Codeine (TYLENOL WITH CODEINE #3 PO) Take 1 tablet by mouth every 6 hours as  needed ?? ?? Yes Historical Provider, MD      ??  Allergies  is allergic to oxycontin [oxycodone hcl].  Family History   Family??History    History reviewed. No pertinent family history.      Social History   Social??History    Social History   ??        Social History   ??? Marital status: Married   ?? ?? Spouse name: N/A   ??? Number of children: N/A   ??? Years of education: N/A   ??  Occupational History   ??? Not on file.   ??         Social History Main Topics   ??? Smoking status: Former Smoker   ?? ?? Quit date: 08/02/1986   ??? Smokeless tobacco: Not on file   ??? Alcohol use 1.2 oz/week    ?? ?? 2 Cans of beer per week    ??? Drug use: No   ??? Sexual activity: Not on file   ??       Other Topics Concern   ??? Not on file   ??      Social History Narrative   ??? No narrative on file      ??  ??  ROS: Pertinent findings in the HPI above. A comprehensive review of systems was Negative. No LMP for male patient. No obstetric history on file.  ??  OBJECTIVE:   VITALS:  height is 5\' 7"  (1.702 m) and weight is 200 lb 2.8 oz (90.8 kg). His blood pressure is 138/65 and his pulse is 56. His respiration is 16 and oxygen saturation is 95%.   ??  CONSTITUTIONAL:This is a 69 y.o. male who is cooperative, pleasant, alert & orientated x 3, and in no acute distress   SKIN: Pink, warm and dry, no rashes   HEAD: Normocephalic, atraumatic, face symmetrical  EYES: PERRL. EOMs intact. Cranial nerves II-XII grossly intact  EARS: Hearing grossly WNL.   NOSE: Nares patent. No rhinorrhea   THROAT: Airway is patent, membranes are moist   NECK: Supple, trachea midline, no lymphadenopathy, no JVD, no bruits  LUNGS: Equal chest expansion and clear to auscultation bilaterally revealing no wheezes, rales or rhonchi. No use of accessory muscles.  CARDIOVASCULAR: Heart sounds are normal. Regular rate and rhythm without murmur, gallop or rub.  ABDOMEN: Generally soft, nontender, nondistended, no masses or organomegaly, no hernias palpable, no guarding or peritoneal signs. Bowel  sounds are present in all four quadrants.   EXTREMITIES: No upper/lower extremity cyanosis, clubbing or edema bilaterally, Intact distal pulses 3+/3+, no calf tenderness, Homans sign negative, strength 5+/5+. Body Position Antalgic. Mobility assisted with cane.  ??  Testing:   EKG: 08/02/2014  Labs: No results found for: HGB, HCT, WBC, PLATELET, NA, K, CL, CO2, BUN, CREATININE, GLUCOSE, CALCIUM, AST, ALT, BILITOT, BILIDIR, AMMONIA, CRP, CKTOTAL, CKMB, HCG, HCGQUANT, PREGSERUM, PTT, INR  ??  CBC: No results for input(s): WBC, HGB, PLT, SEGSPCT, SEGSABS, BANDSPCT, BLASTSPCT, LYMPHOPCT, EOSPCT, SEGSABS, MONOSABS, LYMPHSABS, EOSABS, BASOSABS, DIFFTYPE in the last 72 hours.  ??  Invalid input(s): HCT3  ??  BMP: No results for input(s): NA, K, CL, CO2, BUN, CREATININE, GLUCOSE in the last 72 hours.  ??  Invalid input(s): IBEGFR, IINBEGFR  Calcium:No results for input(s): CALCIUM in the last 72 hours.  Ionized Calcium:No results for input(s): IONCA in the last 72 hours.  Magnesium:No results for input(s): MG in the last 72 hours.  Phosphorus:No results for input(s): PHOS in the last 72 hours.  BNP:No results for input(s): BNP in the last 72 hours.  Glucose:No results for input(s): POCGLU in the last 72 hours.  HgbA1C: No results for input(s): LABA1C in the last 72 hours.  INR: No results for input(s): INR in the last 72 hours.  Hepatic: No results for input(s): ALKPHOS, ALT, AST, PROT, BILITOT, BILIDIR, LABALBU in the last 72 hours.  Amylase and Lipase:No results for input(s): LACTA, AMYLASE in the last 72 hours.  Lactic Acid: No results for input(s): LACTA in the last 72 hours.  Cardiac Enzymes:No results for input(s): CKTOTAL, CKMB, CKMBINDEX, TROPONINI in the last 72 hours.  BNP: No results for input(s): BNP in the last 72 hours.  Lipids: No results for input(s): CHOL, TRIG, HDL, LDLCALC in the last 72 hours.  ??  Invalid input(s): LDL  ABGs: No results found for: PH, PCO2, PO2, HCO3, O2SAT  Thyroid: No results found for: TSH    Urinalysis:   No results for input(s): CLARITYU, COLORU, PHUR, SPECGRAV, PROTEINU, RBCUA, BLOODU, BACTERIA, NITRU, WBCUA, LEUKOCYTESUR, YEAST, GLUCOSEU, BILIRUBINUR in the last 72 hours.  ??  IMPRESSIONS: 1.   C 5 - C 6 C 6 - C 7 Cervical Stenosis  2.    Past??Medical??History??in??prose??(no??negatives)  has a past medical history of Arthritis; Claustrophobia; Depression; Diabetes mellitus (HCC); GERD (gastroesophageal reflux disease); Hyperlipidemia; Hypertension; OSA on CPAP; PTSD (post-traumatic stress disorder); and RLS (restless legs syndrome).      PLANS:   ????  C 5 - C 6 C 6 - C 7 ACDF   ??  ??  Laparis Durrett APRN, ANP-BC  Electronically signed 08/02/2014 at 9:50 AM  ??  ??  ??           PAT APPOINTMENT [facility] on 08/02/2014     ??        Revision & Routing History     ??        Detailed Report     ??   H&P Info   Chartered loss adjusterAuthor Note Status Last Update User   Elberta Leatherwoodandy M Beckett Maden, NP Signed Elberta Leatherwoodandy M Bessy Reaney, NP   Last Update Date/Time: 08/02/2014 10:35 AM   Chart Review Routing History   Recipient Method Report Sent By   Otilio Saberraig T Hopple, MD   Phone: [phone]    In Basket IP Auto Routed Notes Elberta Leatherwoodandy M Akio Hudnall, NP [[id number]]   Sent: 08/02/2014 10:35 AM   Filed: 08/02/2014   Otilio Saberraig T Hopple, MD   Phone: [phone]    In Basket IP Auto Routed Notes Elberta Leatherwoodandy M Rulon Abdalla, NP [[id number]]   Sent: 08/02/2014 10:08 AM   Filed: 08/02/2014         Glendale ChardBABB, Gilford Lardizabal  APRN, APRN, ANP-BC  Electronically signed 08/12/2014 at 10:04 AM

## 2014-08-12 NOTE — Care Coordination-Inpatient (Signed)
08/12/14 1632   Discharge Planning   Type of Residence Private Residence  (Lives in 2 story home with 2 steps to enter. Has 1/2 bath on main level and full bathroom and bedrooms up 12 steps.)   Living Arrangements Spouse/Significant Other   Support Systems Spouse/Significant Other;Children;Family Members;Friends/Neighbors   Current Services Prior To Admission None;Durable Medical Equipment   DME Cane  (Uses cane when back hurting.)   Potential Assistance Needed N/A   Potential Assistance Purchasing Medications No   Does patient want to participate in local refill/meds to beds program? N/A   Type of Home Care Services None   Patient expects to be discharged to: Home   Expected Discharge Date 08/14/14     D/C plan    Met with patient and wife at bedside.  PCP : Dr. Barton Fanny  Pharmacy : [facility] on [address] and [place].  F/U appt with Dr. Virgel Bouquet 08-26-14 at 12:30pm.  No home needs anticipated.

## 2014-08-12 NOTE — Progress Notes (Signed)
Pt received from PACU awake, alert. Iv infuses right hand anterior neck dressing intact, bilat SCD on. Oriented to room and equipment. C/o grittiness left eye, cool washcloth given. Wife at bedside. tol sips water.

## 2014-08-12 NOTE — Brief Op Note (Signed)
Brief Postoperative Note    Joshua CossMarvin Dorsey  Date of Birth:  1946-01-22  [id number]    Pre-operative Diagnosis: Cervical herniated disc     Post-operative Diagnosis: Same    Procedure: ACDF C5-7     Anesthesia: General    Surgeons/Assistants: Dr. De BlanchSpetka/Krystalyn Kubota PA-C    Estimated Blood Loss: less than 50     Complications: None    Specimens: Was Not Obtained    Findings: see op note      Electronically signed by Darden Palmerheresa M Tyron Manetta, PA on 08/12/2014 at 1:14 PM

## 2014-08-12 NOTE — Op Note (Signed)
[facility]                  [address] [place], [place], [place]  [address]                                 OPERATIVE REPORT    Patient: Joshua Dorsey, Joshua Dorsey              Reg#:  [id number]   MRN   [id number]  Birth Date:      05/22/1945  Patient Status:  OA                         Clinic Code:      SAAMB  Room:            [id number]                 Adm:              08/12/2014  Sex:             M                          Dis:  Attending:       Michiel Cowboy, M.D.    Date of Surgery:  08/12/2014  Dct By:          Michiel Cowboy, M.D.  PCP Phys:        Lynett Grimes, M.D.    Document#:       [id number]                    Job#:             [id number]  Date/Time Typed: 08/13/2014 07:11 A         By:               jig  Date/Time Dct:   08/12/2014    02:40 P      PC                A                                              Facility:         [facility]    SURGEON:  Michiel Cowboy, M.D.    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    OPERATIVE PROCEDURE:  1.  C5-C6 anterior cervical diskectomy and fusion.  2.  C6-C7 anterior cervical diskectomy and fusion.  3.  Application of interbody fusion device C5-C6.  4.  Application of interbody fusion device C-6-C7.  5.   Anterior cervical plating C5 through C7.  6.  Microsection.  7.  Use of biologic bone extender.  INDICATION FOR SURGERY:  Radicular arm pain unresponsive to conservative therapy.  MRI shows disk  osteophyte complexes at both C5-C6 and C6-C7.    DESCRIPTION OF OPERATION:  After an adequate level of general endotracheal anesthesia was obtained the  patient was prepared and draped in the  usual sterile fashion.  A transverse  incision was made at the neck and carried down to the platysmal muscle  which was then opened and a plane created  superficial and deep to the  platysma muscle.  Tracheoesophageal bundle was retracted towards the left  side.  The precervical fascial was identified and opened and intraoperative  radiograph was obtained in order to confirm the appropriate level.  Longus  colli muscles were subperiosteally elevated from the body of C5-C6 and C-8  and a   TrimLine retractor was then placed beneath the longus colli  musculature1st at the C6-C7 level.  Caspar retractor posts were then placed  in the body of C6 and C7.  The anterior longitudinal ligament was opened.  The disk space was entered.  The disk and cartilaginous endplates were  removed.  There was a large spondylotic spur centrally and bilateral which  was drilled off and removed well out into the uncovertebral joint.  Once  this was done the endplates were repaired such that 1 is parallel with the  other.  A Lanx cage was then filled with Signafuse and placed into the  interspace and countersunk.  Retractor system was then moved cephalad to  the C5-C6 level where here to the anterior longitudinal ligament was  opened. The disk is entered. The disk and cartilaginous endplates removed.  Spondylotic spur is drilled off and removed and the endplates were prepared  and, again, the Lanx cage was filled with Signafuse, countersunk.  The Lanx  plate was then attached, the locking mechanism is deployed and a final  radiograph was done showing good position of the plate, graft and screws.  irrigated.  Hemostasis obtained.  Incision closed in layers.  Sterile  dressing applied.  Patient awakened in the operating room and taken to  recovery in good condition.              "In order to promptly notify physicians concerning their patients, this  unconfirmed document is being released.  It is not considered final until  reviewed and signed."        cc:    Lynett Grimesraig Hopple, M.D.         Michiel CowboyLawrence M Cynitha Berte, M.D.

## 2014-08-12 NOTE — Anesthesia Pre-Procedure Evaluation (Signed)
Department of Anesthesiology  Preprocedure Note       Name:  Joshua Dorsey   Age:  69 y.o.  DOB:  November 30, 1945                                          MRN:  [id number]         Date:  08/12/2014      Surgeon: Mickey FarberSPETKA    Procedure: ANTERIOR CERVICAL FUSION C5-6 C6-7    Medications prior to admission:   Prior to Admission medications    Medication Sig Start Date End Date Taking? Authorizing Provider   omeprazole (PRILOSEC) 20 MG capsule Take 20 mg by mouth Daily   Yes Historical Provider, MD   buPROPion (WELLBUTRIN) 100 MG tablet Take 200 mg by mouth daily   Yes Historical Provider, MD   hydrochlorothiazide (HYDRODIURIL) 25 MG tablet Take 25 mg by mouth every other day   Yes Historical Provider, MD   metoprolol (LOPRESSOR) 25 MG tablet Take 12.5 mg by mouth 2 times daily   Yes Historical Provider, MD   losartan (COZAAR) 100 MG tablet Take 100 mg by mouth daily   Yes Historical Provider, MD   citalopram (CELEXA) 20 MG tablet Take 20 mg by mouth daily   Yes Historical Provider, MD   atorvastatin (LIPITOR) 20 MG tablet Take 20 mg by mouth daily   Yes Historical Provider, MD   tamsulosin (FLOMAX) 0.4 MG capsule Take 0.4 mg by mouth daily   Yes Historical Provider, MD   fluticasone (FLONASE) 50 MCG/ACT nasal spray 2 sprays by Nasal route daily   Yes Historical Provider, MD   prazosin (MINIPRESS) 2 MG capsule Take 2 mg by mouth nightly   Yes Historical Provider, MD   traZODone (DESYREL) 150 MG tablet Take 150 mg by mouth nightly   Yes Historical Provider, MD   loratadine (CLARITIN) 10 MG tablet Take 10 mg by mouth daily   Yes Historical Provider, MD   meloxicam (MOBIC) 15 MG tablet Take 15 mg by mouth daily   Yes Historical Provider, MD   metFORMIN (GLUCOPHAGE) 500 MG tablet Take 250 mg by mouth daily (with breakfast)   Yes Historical Provider, MD   Probiotic Product (PROBIOTIC COLON SUPPORT PO) Take by mouth daily   Yes Historical Provider, MD   iron polysaccaride (FERREX 150 FORTE PLUS) 50-100 MG CAPS Take 1 capsule by mouth  daily   Yes Historical Provider, MD   ascorbic acid (VITAMIN C) 500 MG tablet Take 500 mg by mouth daily RLS   Yes Historical Provider, MD   Acetaminophen-Codeine (TYLENOL WITH CODEINE #3 PO) Take 1 tablet by mouth every 6 hours as needed   Yes Historical Provider, MD       Current medications:    Current Facility-Administered Medications   Medication Dose Route Frequency Provider Last Rate Last Dose   . ceFAZolin (ANCEF) 2 g in dextrose 4% 100mL IVPB premix  2 g Intravenous Once Michiel CowboyLawrence M Spetka, MD       . lactated ringers infusion   Intravenous Continuous Toma DeitersKenneth A O'Beirne, MD       . sodium chloride flush 0.9 % injection 10 mL  10 mL Intravenous PRN Toma DeitersKenneth A O'Beirne, MD       . lidocaine PF 1 % (PF) injection 1 mL  1 mL Intradermal Once PRN Toma DeitersKenneth A O'Beirne, MD  Allergies:    Allergies   Allergen Reactions   . Oxycontin [Oxycodone Hcl]      Altered mental status       Problem List:  There is no problem list on file for this patient.      Past Medical History:        Diagnosis Date   . Arthritis    . Claustrophobia    . Depression    . Diabetes mellitus (HCC)      borderline   . GERD (gastroesophageal reflux disease)    . Hyperlipidemia    . Hypertension    . OSA on CPAP      nightly   . PTSD (post-traumatic stress disorder)    . RLS (restless legs syndrome)        Past Surgical History:        Procedure Laterality Date   . Cholecystectomy     . Back surgery  2009     lumbar fusion   . Tonsillectomy         Social History:    Social History   Substance Use Topics   . Smoking status: Former Smoker     Quit date: 08/02/1986   . Smokeless tobacco: Not on file   . Alcohol use 1.2 oz/week     2 Cans of beer per week                                Counseling given: Not Answered      Vital Signs (Current):   Vitals:    08/12/14 0913   BP: 133/64   Pulse: 51   Resp: 18   Temp: 97.7 F (36.5 C)   TempSrc: Oral   SpO2: 97%   Weight: 200 lb (90.7 kg)   Height: 5\' 7"  (1.702 m)                                               BP Readings from Last 3 Encounters:   08/12/14 133/64   08/02/14 138/65       NPO Status: Time of last liquid consumption: 1800                        Time of last solid consumption: 1800                        Date of last liquid consumption: 08/11/14                        Date of last solid food consumption: 08/11/14    BMI:   Wt Readings from Last 3 Encounters:   08/12/14 200 lb (90.7 kg)   08/02/14 200 lb 2.8 oz (90.8 kg)     Body mass index is 31.32 kg/(m^2).    Anesthesia Evaluation  Patient summary reviewed and Nursing notes reviewed  Airway: Mallampati: II  TM distance: >3 FB   Neck ROM: full  Mouth opening: > = 3 FB Dental: normal exam         Pulmonary:normal exam  breath sounds clear to auscultation         Cardiovascular:            Rhythm: regular  Rate: normal                 Neuro/Psych:      GI/Hepatic/Renal:           Endo/Other:    (+) Type II DM, well controlled, ,       Abdominal:       Abdomen: soft.             Anesthesia Plan    ASA 3     general     intravenous induction   Anesthetic plan and risks discussed with patient.  Use of blood products discussed with patient whom consented to blood products.   Plan discussed with surgical team, attending and CRNA.  Attending anesthesiologist reviewed and agrees with Pre Eval content          Trisha Mangle, MD   08/12/2014

## 2014-08-12 NOTE — Other (Signed)
PT: states he is doing well and expressed no major needs. PT: was very friendly and open to visit and prayer. PT's Spouse is Coralee North([place]) Chaplain was a comforting supportive presence.     08/12/14 2045   Encounter Summary   Services provided to: Patient   Referral/Consult From: Rounding   Support System Spouse   Contact Congregation Yes   Continue Visiting (08-12-14)   Complexity of Encounter Low   Length of Encounter 15 minutes   Routine   Type Initial   Assessment Calm;Approachable   Intervention Explored feelings, thoughts, concerns;Prayer;Discussed illness/injury and it's impact;Discussed belief system/religious practices/faith   Outcome Expressed gratitude;Receptive

## 2014-08-13 LAB — POC GLUCOSE FINGERSTICK: POC Glucose: 89 mg/dL (ref 75–110)

## 2014-08-13 MED ORDER — OXYCODONE-ACETAMINOPHEN 5-325 MG PO TABS
5-325 MG | ORAL_TABLET | ORAL | 0 refills | Status: AC
Start: 2014-08-13 — End: ?

## 2014-08-13 MED FILL — TAMSULOSIN HCL 0.4 MG PO CAPS: 0.4 MG | ORAL | Qty: 1

## 2014-08-13 MED FILL — OXYCODONE-ACETAMINOPHEN 5-325 MG PO TABS: 5-325 MG | ORAL | Qty: 2

## 2014-08-13 MED FILL — FERREX 150 FORTE PLUS 50-100 MG PO CAPS: 50-100 MG | ORAL | Qty: 1

## 2014-08-13 MED FILL — METOPROLOL TARTRATE 25 MG PO TABS: 25 MG | ORAL | Qty: 1

## 2014-08-13 MED FILL — ATORVASTATIN CALCIUM 20 MG PO TABS: 20 MG | ORAL | Qty: 1

## 2014-08-13 MED FILL — LOSARTAN POTASSIUM 100 MG PO TABS: 100 MG | ORAL | Qty: 1

## 2014-08-13 MED FILL — CETIRIZINE HCL 10 MG PO TABS: 10 MG | ORAL | Qty: 1

## 2014-08-13 MED FILL — PANTOPRAZOLE SODIUM 40 MG PO TBEC: 40 MG | ORAL | Qty: 1

## 2014-08-13 MED FILL — METFORMIN HCL 500 MG PO TABS: 500 MG | ORAL | Qty: 1

## 2014-08-13 MED FILL — PRAZOSIN HCL 1 MG PO CAPS: 1 MG | ORAL | Qty: 2

## 2014-08-13 MED FILL — CITALOPRAM HYDROBROMIDE 20 MG PO TABS: 20 MG | ORAL | Qty: 1

## 2014-08-13 MED FILL — BUPROPION HCL 100 MG PO TABS: 100 MG | ORAL | Qty: 2

## 2014-08-13 MED FILL — DOK 100 MG PO CAPS: 100 MG | ORAL | Qty: 1

## 2014-08-13 MED FILL — HYDROCHLOROTHIAZIDE 25 MG PO TABS: 25 MG | ORAL | Qty: 1

## 2014-08-13 MED FILL — VITAMIN C 500 MG PO TABS: 500 MG | ORAL | Qty: 1

## 2014-08-13 MED FILL — TRAZODONE HCL 50 MG PO TABS: 50 MG | ORAL | Qty: 1

## 2014-08-13 NOTE — Progress Notes (Signed)
Left Eye discomfort has now resolved and patch has been removed.  No sequelae expected.

## 2014-08-13 NOTE — Progress Notes (Signed)
Neurosurgery Progress Note       S:  Patient c/o pain at incision site.        Denies pain or numbness in upper  Extremities         No events overnight per RN    O:  Alert, awake in NAD          Speech is clear and fluent         CN's are intact        Dressing reveals dry blood - dressing was removed                  Strength is good bilaterally in upper extremities         Sensation is intact    A:   POD 1  S/P ACDF C5-7              Ok for discharge home, follow up as scheduled in two weeks

## 2014-08-13 NOTE — Discharge Summary (Signed)
Neurosurgery Discharge Summary    Pre op Diagnsis:  Cervical radiculopathy   Post Op Diagnosis:  Same   Operation:  ACDF C5-7 on 08/12/3014  Hospital course overnight, sent home   Discharge Disposition:  Home   Discharge Medication:  Percocet 5/325 mg 1 -2 tablets PO every 4- 6 hours PRN pain   Follow up:  Patient to follow up in 2 weeks as previously scheduled

## 2014-08-13 NOTE — Plan of Care (Signed)
Problem: Falls - Risk of  Goal: Absence of falls  Outcome: Ongoing  Siderails up x 2  Hourly rounding.  Call light in reach.  Instructed to call for assist before attempting out of bed.  Remains free from falls and accidental injury at this time.   Floor free from obstacles, and bed is locked and in lowest position. Adequate lighting provided.

## 2014-08-14 ENCOUNTER — Encounter: Admit: 2014-08-14 | Discharge: 2014-09-22 | Payer: MEDICARE

## 2014-08-14 ENCOUNTER — Inpatient Hospital Stay
Admit: 2014-08-14 | Discharge: 2014-08-14 | Disposition: A | Discharge status: Home / Self Care | Payer: MEDICARE | Attending: Emergency Medicine

## 2014-08-14 DIAGNOSIS — T888XXS Other specified complications of surgical and medical care, not elsewhere classified, sequela: Secondary | ICD-10-CM

## 2014-08-14 LAB — CBC WITH AUTO DIFFERENTIAL
Absolute Eos #: 0.1 10*3/uL (ref 0.0–0.4)
Absolute Lymph #: 1.6 10*3/uL (ref 1.0–4.8)
Absolute Mono #: 1.4 10*3/uL — ABNORMAL HIGH (ref 0.2–0.8)
Basophils Absolute: 0.1 10*3/uL (ref 0.0–0.2)
Basophils: 1 % (ref 0–2)
Eosinophils %: 1 % (ref 1–4)
Hematocrit: 38.3 % — ABNORMAL LOW (ref 41–53)
Hemoglobin: 12.6 g/dL — ABNORMAL LOW (ref 13.5–17.5)
Lymphocytes: 16 % — ABNORMAL LOW (ref 24–44)
MCH: 30.5 pg (ref 26–34)
MCHC: 32.8 g/dL (ref 31–37)
MCV: 92.9 fL (ref 80–100)
Monocytes: 14 % — ABNORMAL HIGH (ref 1–7)
Platelets: 137 10*3/uL (ref 130–400)
RBC: 4.12 m/uL — ABNORMAL LOW (ref 4.5–5.9)
RDW: 11.4 % — ABNORMAL LOW (ref 11.5–14.5)
Seg Neutrophils: 68 % — ABNORMAL HIGH (ref 36–66)
Segs Absolute: 6.8 10*3/uL (ref 1.8–7.7)
WBC: 10 10*3/uL (ref 3.5–11.0)

## 2014-08-14 LAB — BASIC METABOLIC PANEL
Anion Gap: 10 mmol/L (ref 9–17)
BUN: 16 mg/dL (ref 8–23)
Bun/Cre Ratio: 19 (ref 9–20)
CO2: 32 mmol/L — ABNORMAL HIGH (ref 20–31)
Calcium: 8.8 mg/dL (ref 8.6–10.4)
Chloride: 99 mmol/L (ref 98–107)
Creatinine: 0.86 mg/dL (ref 0.70–1.20)
GFR African American: >60 mL/min (ref >60)
GFR Non-African American: >60 mL/min (ref >60)
Glucose: 107 mg/dL — ABNORMAL HIGH (ref 70–99)
Potassium: 4 mmol/L (ref 3.7–5.3)
Sodium: 141 mmol/L (ref 135–144)

## 2014-08-14 MED ORDER — SODIUM CHLORIDE 0.9 % IV SOLN
0.9 % | INTRAVENOUS | Status: DC
Start: 2014-08-14 — End: 2014-08-16
  Administered 2014-08-14 – 2014-08-16 (×4): via INTRAVENOUS

## 2014-08-14 MED ORDER — DEXTROSE 50 % IV SOLN
50 % | INTRAVENOUS | Status: DC | PRN
Start: 2014-08-14 — End: 2014-08-16

## 2014-08-14 MED ORDER — GLUCOSE 40 % PO GEL
40 % | ORAL | Status: DC | PRN
Start: 2014-08-14 — End: 2014-08-16

## 2014-08-14 MED ORDER — IOVERSOL 74 % IV SOLN
74 % | Freq: Once | INTRAVENOUS | Status: AC | PRN
Start: 2014-08-14 — End: 2014-08-14
  Administered 2014-08-14: 15:00:00 100 mL via INTRAVENOUS

## 2014-08-14 MED ORDER — METOPROLOL TARTRATE 1 MG/ML IV SOLN
1 MG/ML | Freq: Four times a day (QID) | INTRAVENOUS | Status: DC | PRN
Start: 2014-08-14 — End: 2014-08-15

## 2014-08-14 MED ORDER — NORMAL SALINE FLUSH 0.9 % IV SOLN
0.9 % | INTRAVENOUS | Status: DC | PRN
Start: 2014-08-14 — End: 2014-08-16

## 2014-08-14 MED ORDER — ONDANSETRON HCL 4 MG/2ML IJ SOLN
4 MG/2ML | Freq: Once | INTRAMUSCULAR | Status: AC
Start: 2014-08-14 — End: 2014-08-14
  Administered 2014-08-14: 14:00:00 4 mg via INTRAVENOUS

## 2014-08-14 MED ORDER — INSULIN LISPRO 100 UNIT/ML SC SOLN
100 UNIT/ML | Freq: Every evening | SUBCUTANEOUS | Status: DC
Start: 2014-08-14 — End: 2014-08-14

## 2014-08-14 MED ORDER — INSULIN LISPRO 100 UNIT/ML SC SOLN
100 UNIT/ML | Freq: Three times a day (TID) | SUBCUTANEOUS | Status: DC
Start: 2014-08-14 — End: 2014-08-14

## 2014-08-14 MED ORDER — SODIUM CHLORIDE 0.9 % IV BOLUS
0.9 % | Freq: Once | INTRAVENOUS | Status: AC
Start: 2014-08-14 — End: 2014-08-14
  Administered 2014-08-14: 14:00:00 1000 mL via INTRAVENOUS

## 2014-08-14 MED ORDER — DEXTROSE 5 % IV SOLN
5 % | INTRAVENOUS | Status: DC | PRN
Start: 2014-08-14 — End: 2014-08-16

## 2014-08-14 MED ORDER — HYDROMORPHONE HCL 1 MG/ML IJ SOLN
1 MG/ML | INTRAMUSCULAR | Status: DC | PRN
Start: 2014-08-14 — End: 2014-08-16
  Administered 2014-08-14 – 2014-08-15 (×3): 0.5 mg via INTRAVENOUS

## 2014-08-14 MED ORDER — DEXAMETHASONE SODIUM PHOSPHATE 4 MG/ML IJ SOLN
4 MG/ML | Freq: Four times a day (QID) | INTRAMUSCULAR | Status: DC
Start: 2014-08-14 — End: 2014-08-16
  Administered 2014-08-14 – 2014-08-16 (×6): 4 mg via INTRAVENOUS

## 2014-08-14 MED ORDER — HYDROMORPHONE HCL 1 MG/ML IJ SOLN
1 MG/ML | Freq: Once | INTRAMUSCULAR | Status: AC
Start: 2014-08-14 — End: 2014-08-14
  Administered 2014-08-14: 14:00:00 1 mg via INTRAVENOUS

## 2014-08-14 MED ORDER — GLUCAGON HCL RDNA (DIAGNOSTIC) 1 MG IJ SOLR
1 MG | INTRAMUSCULAR | Status: DC | PRN
Start: 2014-08-14 — End: 2014-08-16

## 2014-08-14 MED ORDER — NORMAL SALINE FLUSH 0.9 % IV SOLN
0.9 % | Freq: Two times a day (BID) | INTRAVENOUS | Status: DC
Start: 2014-08-14 — End: 2014-08-16
  Administered 2014-08-15: 12:00:00 10 mL via INTRAVENOUS

## 2014-08-14 MED ORDER — LORAZEPAM 2 MG/ML IJ SOLN
2 MG/ML | Freq: Once | INTRAMUSCULAR | Status: AC
Start: 2014-08-14 — End: 2014-08-14
  Administered 2014-08-14: 17:00:00 0.5 mg via INTRAVENOUS

## 2014-08-14 MED ORDER — ONDANSETRON HCL 4 MG/2ML IJ SOLN
4 MG/2ML | Freq: Four times a day (QID) | INTRAMUSCULAR | Status: DC | PRN
Start: 2014-08-14 — End: 2014-08-16

## 2014-08-14 MED ORDER — DEXAMETHASONE SODIUM PHOSPHATE 10 MG/ML IJ SOLN
10 MG/ML | Freq: Once | INTRAMUSCULAR | Status: AC
Start: 2014-08-14 — End: 2014-08-14
  Administered 2014-08-14: 17:00:00 10 mg via INTRAVENOUS

## 2014-08-14 MED ORDER — HYDROMORPHONE HCL 1 MG/ML IJ SOLN
1 MG/ML | Freq: Once | INTRAMUSCULAR | Status: AC
Start: 2014-08-14 — End: 2014-08-14
  Administered 2014-08-14: 15:00:00 0.5 mg via INTRAVENOUS

## 2014-08-14 MED FILL — DEXAMETHASONE SODIUM PHOSPHATE 10 MG/ML IJ SOLN: 10 MG/ML | INTRAMUSCULAR | Qty: 1

## 2014-08-14 MED FILL — ONDANSETRON HCL 4 MG/2ML IJ SOLN: 4 MG/2ML | INTRAMUSCULAR | Qty: 2

## 2014-08-14 MED FILL — DEXAMETHASONE SODIUM PHOSPHATE 4 MG/ML IJ SOLN: 4 MG/ML | INTRAMUSCULAR | Qty: 1

## 2014-08-14 MED FILL — HYDROMORPHONE HCL 1 MG/ML IJ SOLN: 1 MG/ML | INTRAMUSCULAR | Qty: 1

## 2014-08-14 MED FILL — INSULIN LISPRO 100 UNIT/ML SC SOLN: 100 UNIT/ML | SUBCUTANEOUS | Qty: 0.06

## 2014-08-14 MED FILL — LORAZEPAM 2 MG/ML IJ SOLN: 2 MG/ML | INTRAMUSCULAR | Qty: 1

## 2014-08-14 NOTE — ED Provider Notes (Signed)
[facility]  eMERGENCY dEPARTMENT eNCOUnter      Pt Name: Joshua Dorsey  MRN: [id number]  Birthdate [birth year]  Date of evaluation: 08/14/2014  Provider: Raoul Pitch, PA-C    CHIEF COMPLAINT       Chief Complaint   Patient presents with   ??? Dysphagia     increasing since having cervical fusion 2 days ago(anterior approach)         HISTORY OF PRESENT ILLNESS  (Location/Symptom, Timing/Onset, Context/Setting, Quality, Duration, Modifying Factors, Severity.)   Joshua Dorsey is a 69 y.o. male who presents to the emergency department with trouble swallowing since having a anterior post-cervical fusion that occurred on June 30.  Also concern about dehydration.  Cannot tolerate pain medicines to swallow.  Symptoms worse with swallowing.  No alleviating factors.        Nursing Notes were reviewed.    ALLERGIES     Oxycontin [oxycodone hcl]    CURRENT MEDICATIONS       Current Discharge Medication List      CONTINUE these medications which have NOT CHANGED    Details   oxyCODONE-acetaminophen (PERCOCET) 5-325 MG per tablet 1-2 tablets PO every 4-6 hours PRN pain  Qty: 60 tablet, Refills: 0      omeprazole (PRILOSEC) 20 MG capsule Take 20 mg by mouth Daily      buPROPion (WELLBUTRIN) 100 MG tablet Take 200 mg by mouth daily      hydrochlorothiazide (HYDRODIURIL) 25 MG tablet Take 25 mg by mouth every other day      metoprolol (LOPRESSOR) 25 MG tablet Take 12.5 mg by mouth 2 times daily      losartan (COZAAR) 100 MG tablet Take 100 mg by mouth daily      citalopram (CELEXA) 20 MG tablet Take 20 mg by mouth daily      atorvastatin (LIPITOR) 20 MG tablet Take 20 mg by mouth daily      tamsulosin (FLOMAX) 0.4 MG capsule Take 0.4 mg by mouth daily      fluticasone (FLONASE) 50 MCG/ACT nasal spray 2 sprays by Nasal route daily      prazosin (MINIPRESS) 2 MG capsule Take 2 mg by mouth nightly      traZODone (DESYREL) 150 MG tablet Take 150 mg by mouth nightly      loratadine (CLARITIN) 10 MG tablet Take 10 mg by  mouth daily      metFORMIN (GLUCOPHAGE) 500 MG tablet Take 250 mg by mouth daily (with breakfast)      Probiotic Product (PROBIOTIC COLON SUPPORT PO) Take by mouth daily      iron polysaccaride (FERREX 150 FORTE PLUS) 50-100 MG CAPS Take 1 capsule by mouth daily      ascorbic acid (VITAMIN C) 500 MG tablet Take 500 mg by mouth daily RLS             PAST MEDICAL HISTORY         Diagnosis Date   ??? Arthritis    ??? Claustrophobia    ??? Depression    ??? Diabetes mellitus (HCC)      borderline   ??? GERD (gastroesophageal reflux disease)    ??? Hyperlipidemia    ??? Hypertension    ??? OSA on CPAP      nightly   ??? PTSD (post-traumatic stress disorder)    ??? RLS (restless legs syndrome)        SURGICAL HISTORY  Procedure Laterality Date   ??? Cholecystectomy     ??? Back surgery  2009     lumbar fusion   ??? Tonsillectomy     ??? Cervical fusion  08/12/2014     ACDF C5-6, C6-7         FAMILY HISTORY     History reviewed. No pertinent family history.  No family status information on file.        SOCIAL HISTORY      reports that he quit smoking about 28 years ago. He does not have any smokeless tobacco history on file. He reports that he drinks about 1.2 oz of alcohol per week  He reports that he does not use illicit drugs.    REVIEW OF SYSTEMS    (2-9 systems for level 4, 10 or more for level 5)   Review of Systems    Except as noted above the remainder of the review of systems was reviewed and negative.     PHYSICAL EXAM    (up to 7 for level 4, 8 or more for level 5)   ED Triage Vitals   BP Temp Temp Source Pulse Resp SpO2 Height Weight   08/14/14 0842 08/14/14 0842 08/14/14 0842 08/14/14 0842 08/14/14 0842 08/14/14 0842 08/14/14 0842 08/14/14 0842   121/56 98.6 ??F (37 ??C) Oral 60 16 99 % 5\' 8"  (1.727 m) 195 lb (88.5 kg)     Physical Exam   Constitutional: He is oriented to person, place, and time. He appears well-developed.   HENT:   Head: Normocephalic and atraumatic.   Eyes: EOM are normal.   Neck: Normal range of motion. Neck  supple.       Cardiovascular: Normal rate and regular rhythm.    Pulmonary/Chest: Effort normal and breath sounds normal.   Abdominal: Soft.   Musculoskeletal: Normal range of motion.   Neurological: He is alert and oriented to person, place, and time.   Skin: Skin is warm.   Psychiatric: He has a normal mood and affect. His behavior is normal.               DIAGNOSTIC RESULTS     EKG: All EKG's are interpreted by the Emergency Department Physician who either signs or Co-signs this chart in the absence of a cardiologist.        RADIOLOGY:   Non-plain film images such as CT, Ultrasound and MRI are read by the radiologist. Plain radiographic images are visualized and preliminarily interpreted by the emergency physician with the below findings:        Interpretation per the Radiologist below, if available at the time of this note:          ED BEDSIDE ULTRASOUND:   Performed by ED Physician - none    LABS:  Labs Reviewed   CBC WITH AUTO DIFFERENTIAL - Abnormal; Notable for the following:     RBC 4.12 (*)     Hemoglobin 12.6 (*)     Hematocrit 38.3 (*)     RDW 11.4 (*)     Seg Neutrophils 68 (*)     Lymphocytes 16 (*)     Monocytes 14 (*)     Absolute Mono # 1.40 (*)     All other components within normal limits   BASIC METABOLIC PANEL - Abnormal; Notable for the following:     Glucose 107 (*)     CO2 32 (*)     All other components within normal limits  POCT GLUCOSE       All other labs were within normal range or not returned as of this dictation.    EMERGENCY DEPARTMENT COURSE and DIFFERENTIAL DIAGNOSIS/MDM:   Vitals:    Vitals:    08/14/14 0842 08/14/14 1550 08/14/14 1757 08/14/14 1953   BP: 121/56 140/62  129/61   Pulse: 60 62  62   Resp: 16 16  16    Temp: 98.6 ??F (37 ??C) 99.2 ??F (37.3 ??C)  98.3 ??F (36.8 ??C)   TempSrc: Oral Oral  Oral   SpO2: 99% 91%  95%   Weight: 195 lb (88.5 kg)  207 lb 6 oz (94.1 kg)    Height: 5\' 8"  (1.727 m)        Patient will be admitted due to postoperative hematoma.    CONSULTS:  IP  CONSULT TO NEUROSURGERY  IP CONSULT TO RESPIRATORY CARE  IP CONSULT TO DIETITIAN  IP CONSULT TO HOSPITALIST    PROCEDURES:  Procedures        FINAL IMPRESSION      1. Hematoma - postoperative, sequela          DISPOSITION/PLAN   DISPOSITION Admitted    PATIENT REFERRED TO:   No follow-up provider specified.    DISCHARGE MEDICATIONS:     Current Discharge Medication List              (Please note that portions of this note were completed with a voice recognition program.  Efforts were made to edit the dictations but occasionally words are mis-transcribed.)    Raoul PitchMatthew Christopher Jeramey Lanuza, PA-C            Raoul PitchMatthew Christopher Shilah Hefel, PA-C  08/14/14 2006

## 2014-08-14 NOTE — ED Notes (Signed)
Patient to CT scan.       Joshua LoganJustin Hershy Flenner, RN  08/14/14 1036

## 2014-08-14 NOTE — ED Notes (Signed)
Patient resting quietly at this time.     Geanie LoganJustin Dessie Delcarlo, RN  08/14/14 [phone]

## 2014-08-14 NOTE — Progress Notes (Signed)
Pharmacy Medication History Note    The patient's list of current home medications has been reviewed.    Based on information provided by the following source(s), no changes to the patient's home medication list were necessary.  Source(s) of information: Patient spouse    Discrepancies on current hospital orders that need to be addressed by a physician:  ?? none      Notes/updates regarding the patient's home medication list:    Medications already discontinued or flagged for removal at the time of this note:  ?? none    Medications discontinued:  ?? none    Medications added:  ?? none    Medications adjusted to reflect current prescribed dose:  ?? none    Medications that the patient is taking differently than prescribed (including reason if known):  ?? none    Other notes:  ?? Denies use of additional OTC or herbal medications          Please feel free to call me with any questions about this encounter. Thank you.    Dorthey SawyerLinda Sadler Teschner, [facility] CenterRPH  Pharmacy Medication Accuracy Review Service  [phone]      Electronically signed by Dorthey SawyerLinda Mandolin Falwell, [facility] CenterRPH on 08/14/2014 at 4:52 PM

## 2014-08-14 NOTE — ED Notes (Signed)
Dr. Eulis ManlyHusein at bedside.     Geanie LoganJustin Reannah Totten, RN  08/14/14 1228

## 2014-08-14 NOTE — ED Notes (Signed)
Patient states that he had a cervical neck fusion yesterday by Dr. Mickey FarberSpetka. Wife states that he is having trouble swallowing. He states that he can't even take water. She also states that his speech is garbled at this time. No s/s of distress noted.     Geanie LoganJustin Tishawn Friedhoff, RN  08/14/14 [phone]

## 2014-08-14 NOTE — ED Notes (Signed)
Patient back from ct scan.     Geanie LoganJustin Ashtynn Berke, RN  08/14/14 1106

## 2014-08-14 NOTE — Progress Notes (Signed)
Pod #2 s/p C5-7 ACDF with increased difficulty swallowing without hoarseness. No c/o aspiration. No fever or chills. His arm symptoms have resolved since surgery. No drainage noted from wound. VS wnl. Neurologically intact on examination in the ER. CT of neck with contrast shows soft tissue swelling without discrete abcess despite radiologist's report. i will admit him for close observation and IV pain meds and steroids.

## 2014-08-14 NOTE — Progress Notes (Signed)
Pt. Was admitted to 2019-2 per wheelchair to  Room per  ED staff and his wife.   Call light in reach.  IV patent. Admission data base completed.  Aspiration precautions.  HOB elevated.  NPO at this time.   Medicated with IV dilaudid for pain to throat. See care plan.

## 2014-08-14 NOTE — Other (Addendum)
Patient Acct Nbr:  [id number]  Primary AUTH/CERT:    Primary Insurance Company Name:   MEDICARE  Primary Insurance Plan Name:    Primary Insurance Group Number:    Primary Insurance Plan Type: [facility] [employer]  Primary Insurance Policy Number:  [id number]    Secondary AUTH/CERT:    Secondary Insurance Company Name:   [facility]  Secondary Insurance Plan Name:    Secondary Insurance Group Number:    Secondary Insurance Plan Type: [employer]  Secondary Insurance Policy Number:  [id number]

## 2014-08-14 NOTE — ED Provider Notes (Addendum)
Attending Supervisory Note/Shared Visit   I have personally performed a face to face diagnostic evaluation on this patient. I have reviewed the mid-level???s findings and agree.     CT imaging shows fluid collection the prevertebral soft tissues pace in the cervical area, likely hematoma from his recent surgery.  This is causing esophageal compression resulting in the patient's dysphagia.  Patient was administered IV Decadron and did not have improvement in symptoms right away.  The plan is to admit him and treated with IV fluids and continue his IV steroids.  Dr. Earna CoderZachary has been contacted and is admitting him.    (Please note that portions of this note were completed with a voice recognition program.  Efforts were made to edit the dictations but occasionally words are mis-transcribed.)    Wess BottsSyed Z Issaac Shipper, MD  Attending Emergency Physician       Wess BottsSyed Z Weiland Tomich, MD  08/14/14 [id number]       Wess BottsSyed Z Tavious Griesinger, MD  08/14/14 [phone]

## 2014-08-15 LAB — POC GLUCOSE FINGERSTICK
POC Glucose: 146 mg/dL — ABNORMAL HIGH (ref 75–110)
POC Glucose: 134 mg/dL — ABNORMAL HIGH (ref 75–110)
POC Glucose: 134 mg/dL — ABNORMAL HIGH (ref 75–110)
POC Glucose: 131 mg/dL — ABNORMAL HIGH (ref 75–110)

## 2014-08-15 MED ORDER — LORAZEPAM 2 MG/ML IJ SOLN
2 MG/ML | INTRAMUSCULAR | Status: DC | PRN
Start: 2014-08-15 — End: 2014-08-16
  Administered 2014-08-16: 05:00:00 1 mg via INTRAVENOUS

## 2014-08-15 MED ORDER — HYDRALAZINE HCL 20 MG/ML IJ SOLN
20 MG/ML | Freq: Four times a day (QID) | INTRAMUSCULAR | Status: DC | PRN
Start: 2014-08-15 — End: 2014-08-16

## 2014-08-15 MED ORDER — METOPROLOL TARTRATE 1 MG/ML IV SOLN
1 MG/ML | Freq: Four times a day (QID) | INTRAVENOUS | Status: DC | PRN
Start: 2014-08-15 — End: 2014-08-16

## 2014-08-15 MED ORDER — LUBRIFRESH P.M. OP OINT
OPHTHALMIC | Status: DC | PRN
Start: 2014-08-15 — End: 2014-08-16
  Administered 2014-08-15 – 2014-08-16 (×2): via OPHTHALMIC

## 2014-08-15 MED ORDER — LORAZEPAM 2 MG/ML IJ SOLN
2 MG/ML | INTRAMUSCULAR | Status: DC | PRN
Start: 2014-08-15 — End: 2014-08-16
  Administered 2014-08-15: 18:00:00 0.5 mg via INTRAVENOUS

## 2014-08-15 MED ORDER — ALPRAZOLAM 0.5 MG PO TABS
0.5 MG | Freq: Two times a day (BID) | ORAL | Status: DC | PRN
Start: 2014-08-15 — End: 2014-08-16
  Administered 2014-08-16: 18:00:00 0.5 mg via ORAL

## 2014-08-15 MED ORDER — INSULIN LISPRO 100 UNIT/ML SC SOLN
100 UNIT/ML | Freq: Four times a day (QID) | SUBCUTANEOUS | Status: DC
Start: 2014-08-15 — End: 2014-08-16
  Administered 2014-08-15 – 2014-08-16 (×4): 1 [IU] via SUBCUTANEOUS

## 2014-08-15 MED FILL — ALPRAZOLAM 0.5 MG PO TABS: 0.5 MG | ORAL | Qty: 1

## 2014-08-15 MED FILL — ONDANSETRON HCL 4 MG/2ML IJ SOLN: 4 MG/2ML | INTRAMUSCULAR | Qty: 2

## 2014-08-15 MED FILL — LORAZEPAM 2 MG/ML IJ SOLN: 2 MG/ML | INTRAMUSCULAR | Qty: 1

## 2014-08-15 MED FILL — DEXAMETHASONE SODIUM PHOSPHATE 4 MG/ML IJ SOLN: 4 MG/ML | INTRAMUSCULAR | Qty: 1

## 2014-08-15 MED FILL — HYDROMORPHONE HCL 1 MG/ML IJ SOLN: 1 MG/ML | INTRAMUSCULAR | Qty: 1

## 2014-08-15 MED FILL — LUBRIFRESH P.M. OP OINT: OPHTHALMIC | Qty: 3.5

## 2014-08-15 NOTE — Consults (Signed)
Inpatient consult to Hospitalist  Consult performed by: Leanora Ivanoff  Consult ordered by: Theador Hawthorne             [facility]  Internal Medicine Consultation    Name: Joshua Dorsey  MRN: [id number]     Acct: [id number]  Room: [id number]     Admit Date: 08/14/2014  PCP: Otilio Saber, MD    We have been asked to follow this patient for medical management at request of DR [facility].     CHIEF COMPLAINT:   Chief Complaint   Patient presents with   ??? Dysphagia     increasing since having cervical fusion 2 days ago(anterior approach)       History Obtained From:  patient    HISTORY OF PRESENT ILLNESS:    Joshua Dorsey is a Caucasian 69 y.o.  male who presents with difficulty swallowing that started Saturday, 2 days after cervical decompression surgery. Surgery had gone well and was discharged home. Friday had continued sore throat and into Saturday developed trouble swallowing. Has not improved with IV steroids here as of yet. No other associated symptoms. Pain reasonably controlled. Some increased anxiety issues with his current situation. We are asked to manage HTN and routine medical care. Has history of prediabetes controlled with low dose metformin.    Past Medical History:    Past Medical History   Diagnosis Date   ??? Arthritis    ??? Claustrophobia    ??? Depression    ??? Diabetes mellitus (HCC)      borderline   ??? GERD (gastroesophageal reflux disease)    ??? Hyperlipidemia    ??? Hypertension    ??? OSA on CPAP      nightly   ??? PTSD (post-traumatic stress disorder)    ??? RLS (restless legs syndrome)         Past Surgical History:    Past Surgical History   Procedure Laterality Date   ??? Cholecystectomy     ??? Back surgery  2009     lumbar fusion   ??? Tonsillectomy     ??? Cervical fusion  08/12/2014     ACDF C5-6, C6-7        Medications Prior to Admission:    Prior to Admission medications    Medication Sig Start Date End Date Taking? Authorizing Provider   oxyCODONE-acetaminophen (PERCOCET) 5-325 MG per tablet  1-2 tablets PO every 4-6 hours PRN pain 08/13/14  Yes Darden Palmer, PA   omeprazole (PRILOSEC) 20 MG capsule Take 20 mg by mouth Daily   Yes Historical Provider, MD   buPROPion (WELLBUTRIN) 100 MG tablet Take 200 mg by mouth daily   Yes Historical Provider, MD   hydrochlorothiazide (HYDRODIURIL) 25 MG tablet Take 25 mg by mouth every other day   Yes Historical Provider, MD   metoprolol (LOPRESSOR) 25 MG tablet Take 12.5 mg by mouth 2 times daily   Yes Historical Provider, MD   losartan (COZAAR) 100 MG tablet Take 100 mg by mouth daily   Yes Historical Provider, MD   citalopram (CELEXA) 20 MG tablet Take 20 mg by mouth daily   Yes Historical Provider, MD   atorvastatin (LIPITOR) 20 MG tablet Take 20 mg by mouth daily   Yes Historical Provider, MD   tamsulosin (FLOMAX) 0.4 MG capsule Take 0.4 mg by mouth daily   Yes Historical Provider, MD   fluticasone (FLONASE) 50 MCG/ACT nasal spray 2 sprays by Nasal route daily   Yes  Historical Provider, MD   prazosin (MINIPRESS) 2 MG capsule Take 2 mg by mouth nightly   Yes Historical Provider, MD   traZODone (DESYREL) 150 MG tablet Take 150 mg by mouth nightly   Yes Historical Provider, MD   loratadine (CLARITIN) 10 MG tablet Take 10 mg by mouth daily   Yes Historical Provider, MD   metFORMIN (GLUCOPHAGE) 500 MG tablet Take 250 mg by mouth daily (with breakfast)   Yes Historical Provider, MD   Probiotic Product (PROBIOTIC COLON SUPPORT PO) Take by mouth daily   Yes Historical Provider, MD   iron polysaccaride (FERREX 150 FORTE PLUS) 50-100 MG CAPS Take 1 capsule by mouth daily   Yes Historical Provider, MD   ascorbic acid (VITAMIN C) 500 MG tablet Take 500 mg by mouth daily RLS   Yes Historical Provider, MD        Allergies:  Oxycontin [oxycodone hcl]    Social History:   Tobacco:    reports that he quit smoking about 28 years ago. He does not have any smokeless tobacco history on file.  Alcohol:      reports that he drinks about 1.2 oz of alcohol per week   Drug Use:   reports that he does not use illicit drugs.    Family History:   Family History   Problem Relation Age of Onset   ??? Dementia Mother    ??? Coronary Art Dis Father      MI while dancing       REVIEW OF SYSTEMS:  Constitutional:  negative for  fevers, chills, sweats, fatigue, malaise, anorexia and weight loss  Eyes:  negative for  double vision, blurred vision, dry eyes, blind spots, discharge, visual disturbance, irritation and redness  HEENT:  negative except for  sore throat and swallowing problems.  Respiratory:  negative for  dry cough, cough with sputum, dyspnea, wheezing, hemoptysis, chest pain, pleuritic pain and cyanosis  Cardiovascular:  negative for  chest pain, dyspnea, palpitations, orthopnea, PND, exertional chest pressure/discomfort, fatigue, early saiety, edema, syncope  Gastrointestinal:  negative for nausea, vomiting, diarrhea, constipation, abdominal pain, pruritus, abdominal distention, jaundice, dysphagia, reflux, hematemesis and hematochezia  Genitourinary:  negative for frequency, dysuria, nocturia, urinary incontinence, hesitancy, decreased stream and hematuria  Integument/Breast:  negative for rash, skin lesions, dryness, or color change, pruritus, changes in hair or nails, breast lump, nipple discharge, tenderness and change in skin over the breast  Hematologic/Lymphatic:  negative for easy bruising, bleeding, lymphadenopathy, petechiae and swelling/edema  Allergic/Immunologic:  negative for recurrent infections, urticaria, hay fever, angioedema, anaphylaxis and drug reactions  Endocrine:  negative for heat or cold intolerance, tremor, weight changes, change in bowel habits and hair loss  Musculoskeletal:  negative for  myalgias, arthralgias, pain, joint swelling, stiff joints, decreased range of motion, muscle weakness and bone pain  Neurological:  negative except for weakness and pain  Behavior/Psych:  negative except for anxiety    Code Status:  Full Code    PHYSICAL EXAM:  Vitals:    Visit  Vitals   ??? BP 123/59   ??? Pulse 61   ??? Temp 98.2 ??F (36.8 ??C) (Oral)   ??? Resp 18   ??? Ht 5\' 8"  (1.727 m)   ??? Wt 207 lb 6 oz (94.1 kg)   ??? SpO2 96%   ??? BMI 31.53 kg/m2       Temp (24hrs), Avg:98.4 ??F (36.9 ??C), Min:98 ??F (36.7 ??C), Max:99.2 ??F (37.3 ??C)    General appearance -  alert, well appearing, and in no distress and oriented to person, place, and time  Mental status - alert, oriented to person, place, and time, normal mood, behavior, speech, dress, motor activity, and thought processes  Eyes - pupils equal and reactive, extraocular eye movements intact  Ears - right ear normal, left ear normal  Nose - normal and patent, no erythema, discharge or polyps  Mouth - mucous membranes moist, pharynx normal without lesions  Neck - supple, no significant adenopathy, carotids upstroke normal bilaterally, no bruits, thyroid exam: thyroid is normal in size without nodules or tenderness  Lymphatics - no palpable lymphadenopathy, no hepatosplenomegaly  Chest - clear to auscultation, no wheezes, rales or rhonchi, symmetric air entry  Heart - normal rate, regular rhythm, normal S1, S2, no murmurs, rubs, clicks or gallops, no JVD  Abdomen - soft, nontender, nondistended, no masses or organomegaly  no rebound tenderness noted  bowel sounds normal  Back exam - limited range of motion  Neurological - alert, oriented, normal speech, no focal findings or movement disorder noted, neck supple without rigidity, cranial nerves II through XII intact  Musculoskeletal - no joint tenderness, deformity or swelling  Extremities - peripheral pulses normal, no pedal edema, no clubbing or cyanosis  Skin - normal coloration and turgor, no rashes, no suspicious skin lesions noted     DATA:    Hematology:  Recent Labs      08/14/14   0931   WBC  10.0   HGB  12.6*   HCT  38.3*   PLT  137     Chemistry:  Recent Labs      08/14/14   0931   NA  141   K  4.0   CL  99   CO2  32*   GLUCOSE  107*   BUN  16   CREATININE  0.86   CALCIUM  8.8     No results for  input(s): PROT, LABALBU, LABA1C, T3TOTAL, T4TOTAL, FT4, TSH, AST, ALT, LDH, GGT, ALKPHOS, BILITOT, BILIDIR, AMMONIA, AMYLASE, LIPASE, LACTATE, CHOL, HDL, LDLCHOLESTEROL, CHOLHDLRATIO, TRIG, VLDL, BNP, TROPONINI, CKTOTAL, CKMB, CKMBINDEX in the last 72 hours.    ASSESSMENT:    Primary Problem  Esophageal dysphagia      Active Hospital Problems    Diagnosis Date Noted   ??? Prediabetes [R73.09] 08/15/2014   ??? Essential hypertension [I10]    ??? Hyperlipidemia [E78.5]    ??? Esophageal dysphagia [R13.14] 08/14/2014       PLAN:  1. Admit per NS.  2. IV decadron.  3. Prn IV lopressor and hydralazine.  4. Insulin scale as ordered.  5. IV ativan prn.  6. Monitor and control glucose and BP.  7. GI & DVT prophylaxis as needed.  8. NS eval in progress.  9. Monitor for signs of infection, fluid collection on CT, most likely routine postoperative changes.  10. Thanks for consult, will follow with you.      Electronically signed by Leanora Ivanoff, DO on 08/15/2014 at 1:21 PM     Copy sent to Dr. Otilio Saber, MD

## 2014-08-15 NOTE — Care Coordination-Inpatient (Signed)
08/15/14 0241   Discharge Planning   Type of [place] Spouse/Significant Other;Children;Family Members;Friends/Neighbors   Current Services Prior To Admission C-pap;Durable Medical Equipment   DME Cane;Cpap   Potential Assistance Needed N/A   Potential Assistance Purchasing Medications No   Does patient want to participate in local refill/meds to beds program? N/A   Type of Home Care Services None   Patient expects to be discharged to: home   Expected Discharge Date 08/17/14   Follow Up Appointment: Best Day/Time  Monday AM     PCP is Dr Ramond Marrow  Neurosurgeon was Dr Erie Noe  DME cane and CPAP  Pharmacy is [facility] on [address]    Met with patient and wife at the bedside. They live in 2 story home. Patient is very independent and has never been to SNF nor used home care in the past .    Will continue to watch, he is a readmit post surgery and possible home care for skilled RN assessment.

## 2014-08-15 NOTE — H&P (Signed)
[facility]                  [address] [place], [place], [place]  [address]                               HISTORY AND PHYSICAL    Patient:         Joshua Dorsey, Joshua Dorsey          Reg#:  [id number]   MRN      [id number]  Birth Date:      05-04-1945  Patient Status:  EA                         Clinic      SAEMR                                              Code:  Room:            [id number]                 Adm:        08/14/2014  Sex:             M                          Dis:  Atd Phys:        Theador Hawthorne, M.D.  Dct By:          Theador Hawthorne, M.D.  PCP Phys:        Lynett Grimes, M.D.    Document#:       [id number]                    Job#:       [id number]  Date/Time Typed: 08/15/2014 07:39 A         By:         Izola Price  Date/Time Dct:   08/15/2014    06:45 A      PC          A                                              Facility:   [facility]    HISTORY OF PRESENT ILLNESS:  This is a 69 year old gentleman who a couple of days ago underwent anterior  cervical diskectomy and fusion at C5-C6 and C6-C7 by Dr. Mickey Farber.  He  presented with increase in trouble swallowing.  He was given IV Decadron in  the emergency room but unable to improve and he was admitted for IV  hydration and IV steroids.    PAST HISTORY:  Significant for arthritis, claustrophobia, depression, diabetes, GERD,  hyperlipidemia, hypertension, obstructive sleep apnea on CPAP,  post-traumatic stress disorder, restless leg syndrome.    PAST SURGICAL HISTORY:  Includes ACDF at C5-C6 and C6-C7, cholecystectomy, back surgery in 2009 and  tonsillectomy.    MEDICATIONS:  Numerous and listed in the chart including Prilosec, Wellbutrin,  hydrochlorothiazide, Lopressor, Cozaar, Celexa, Lipitor, Flomax, Flonase,  Minipress, Desyrel, Claritin, Mobic, Glucophage, probiotic, and Percocet.  ALLERGIES:  OxyContin.    FAMILY HISTORY:  Noncontributory.    SOCIAL HISTORY:  Nonsmoker but he is a former smoker and quit June 20th 1988.  Drinker  and  alcohol use 2 cans of beer per week.    REVIEW OF SYSTEMS:  Important and pertinent findings are in the history of present illness.  A  comprehensive review of systems was negative.  No evidence of aspiration or  fevers or chills.  No GI symptoms.    PHYSICAL EXAMINATION:  On examination he is afebrile.  Vital signs within normal limits.  He was  mildly hypertensive on admission; it is controlled now by medical service  Dr. Ninfa LindenAhmad.  His skin is warm.  Head is normocephalic, atraumatic.  Eyes:  Normal.  Ears:  Normal.  Nose:  Normal.  Throat:  ___.  Neck is supple.  Incision is clean and dry and flat without signs of infection or hematoma.  Lungs are clear.  Heart is regular rate and rhythm.  Abdomen is soft and  nontender.  Extremities:  Without clubby, cyanosis or edema.  Neurologically:  He is intact.    DIAGNOSTIC TESTING:  His CT shows soft tissue swelling without any discrete hematoma or abscess  in my opinion.  I have read the radiologist report and I disagree that this  is not an abscess especially he is only 2 days after surgery without any  other systemic signs of infection.    IMPRESSION:  Postoperative soft tissue swelling status post anterior cervical diskectomy  and fusion and he is being admitted for IV Decadron, medical management and  IV fluid hydration.            "In order to promptly notify physicians concerning their patients, this  unconfirmed document is being released.  It is not considered final until  reviewed and signed."        cc:

## 2014-08-15 NOTE — Plan of Care (Signed)
Problem: Falls - Risk of  Goal: Absence of falls  Outcome: Ongoing  Bed is in lowest position.  2/4 side rails up.  All wheels lockedX4.  Call light within reach. Clutter removed. Bedside table within reach.  Nonskid footwear on.  Bed alarm on.   Patient instructed to call out for assistance if needing out of bed.   Hourly rounding.  Will continue to monitor.

## 2014-08-15 NOTE — Plan of Care (Signed)
Problem: Pain:  Goal: Pain level will decrease  Pain level will decrease   Outcome: Ongoing  Patient instructed on pain assessment and pain scale. Pain assessment completed. Patient states that he has minimum pain and rates his pain level  3/10 on pain scale. PRN pain medications available were duscussed with pt he states no need for them at this time. Pt instructed to call out with any increase in pain.

## 2014-08-15 NOTE — Progress Notes (Signed)
AVSS, he had a bout of anxiety this am that's resolved. He feels much better but still unable to swallow liquids. No evidence of respiratory compromise. Neurologically he's intact. Neck wound is C/D/I without swelling, drainage, or signs of infection. We will continue with IV hydration and steroids. Dr Ninfa LindenAhmad on board for medical management and hid BP is well controlled.

## 2014-08-15 NOTE — Plan of Care (Signed)
Problem: Falls - Risk of  Intervention: Fall risk assessment  Ongoing fall risk.  He says sometimes his knee feels like it "will give out on me".  Cane at bedside for ambulation to the bathroom.  Intervention: Postfall assessment  No falls noted.  Intervention: Fall precautions  Fall precautions in place.  Call light in reach.  Instructed pt. Not to get oob without staff assist or wife assist.    Intervention: Toileting assistance  Assistance provided for  adl's and for  Toileting.       Comments:   Continue plan of care.

## 2014-08-15 NOTE — Plan of Care (Signed)
Problem: Anxiety/Stress:  Intervention: A calm environment  Pt called out with complaints of claustrophobia and anxiety. Pt states that he feels like the room is closing in on him and is very claustrophobic and anxious. I removed restricting equipment such as EPC's cuffs, extra pillows and blankets. Pt room and bathroom doors were opened and lights turned on per pt request. Dr. Wayland SalinasShowkat notified of situation and ordered prn xanax.

## 2014-08-15 NOTE — Progress Notes (Signed)
Nutrition Assessment    Type and Reason for Visit: Initial, Positive Nutrition Screen, Consult    Nutrition Recommendations: 1. Continue with NPO except ice chips.  2. Await advancement to PO diet.  3. Discussed Pt with Joshua Dorsey (Nursing):  anticipate soft tissue swelling to improve with being on IV steroids longer--per MD's.  4. Will F/U with Pt tomorrow.    Malnutrition Assessment:  ?? Malnutrition Status: At risk for malnutrition  ?? Findings of the 6 clinical characteristics of malnutrition (Minimum of 2 out of 6 clinical characteristics is required to make the diagnosis of moderate or severe Protein Calorie Malnutrition based on AND/ASPEN Guidelines):  1. Energy Intake-Less than or equal to 50%,  (x 3 days)    2. Weight Loss-2% loss or greater,  (x 2 weeks)  3. Fat Loss-No significant subcutaneous fat loss,   4. Muscle Loss-No significant muscle mass loss,   5. Fluid Accumulation-No significant fluid accumulation,    6. Grip Strength-Not measured    Nutrition Diagnosis:   ?? Problem: Inadequate oral intake  ?? Etiology: related to Difficulty swallowing    ??? Signs and symptoms:  as evidenced by NPO status due to medical condition, +Wt loss of 4.1 kg (4.5%) since 08/02/14.    Nutrition Assessment:  ?? Subjective Assessment: Spoke with Pt/Pt's Wife:  +poor oral intake, +swallowing problems, which developed s/p having cervical fusion surgery (6/30), and +unintentional wt loss of 4.1 kg (4.5%) since 6/20.  UBW=~91 kg (200 lb).  Currently is NPO except ice chips status; on IVF @ 125 ml/hr.  ?? Nutrition-Focused Physical Findings: Edema:  +periorbital, +anterior neck; Skin:  +IV site (R) forearm, +steri strips to neck  ?? Wound Type: Surgical Wound  ?? Current Nutrition Therapies:  ?? Oral Diet Orders: NPO   ?? Anthropometric Measures:  ?? Ht: 5\' 8"  (172.7 cm)   ?? Admission Body Wt: 191 lb 5.8 oz (86.8 kg) (Pt stated; from home.)  ?? Usual Body Wt: 200 lb 9.9 oz (91 kg)  ?? % Weight Change: 4.5%,  x 2 weeks  ?? Ideal Body Wt: 156 lb  8.4 oz (71 kg), % Ideal Body 133%  ?? BMI Classification: BMI 30.0 - 34.9 Obese Class I  ?? Comparative Standards (Estimated Nutrition Needs):  ?? Estimated Daily Total Kcal: 2,250-2,400 kcal (post-op)  ?? Estimated Daily Protein (g): 85-95 g (post-op)    Estimated Intake vs Estimated Needs: Intake Less Than Needs    Nutrition Risk Level: High    Nutrition Interventions:   Continue NPO  Continued Inpatient Monitoring    Nutrition Evaluation:   ?? Evaluation: Goals set   ?? Goals: PO intake to meet >75% estimated post-op needs, with good tolerance of diet consistency    ?? Monitoring: NPO Status, Skin Integrity, Wound Healing, Weight Status, Pertinent Labs, Chewing/Swallowing, Patient/Family Education    See Adult Nutrition Doc Flowsheet for more detail.     Electronically signed by Joshua SamplesHERYL  Nelani Dorsey, MSEd, RDN, LD on 08/15/14 at 3:13 PM    Contact Number: [phone]

## 2014-08-16 LAB — POC GLUCOSE FINGERSTICK
POC Glucose: 146 mg/dL — ABNORMAL HIGH (ref 75–110)
POC Glucose: 142 mg/dL — ABNORMAL HIGH (ref 75–110)

## 2014-08-16 MED ORDER — OXYCODONE-ACETAMINOPHEN 5-325 MG PO TABS
5-325 MG | ORAL | Status: DC | PRN
Start: 2014-08-16 — End: 2014-08-16

## 2014-08-16 MED ORDER — PROBIOTIC COLON SUPPORT PO CAPS
Freq: Every day | ORAL | Status: DC
Start: 2014-08-16 — End: 2014-08-16

## 2014-08-16 MED ORDER — TAMSULOSIN HCL 0.4 MG PO CAPS
0.4 MG | Freq: Every day | ORAL | Status: DC
Start: 2014-08-16 — End: 2014-08-16

## 2014-08-16 MED ORDER — TRAZODONE HCL 50 MG PO TABS
50 MG | Freq: Every evening | ORAL | Status: DC
Start: 2014-08-16 — End: 2014-08-16

## 2014-08-16 MED ORDER — METFORMIN HCL 500 MG PO TABS
500 MG | Freq: Every day | ORAL | Status: DC
Start: 2014-08-16 — End: 2014-08-16

## 2014-08-16 MED ORDER — CITALOPRAM HYDROBROMIDE 20 MG PO TABS
20 MG | Freq: Every day | ORAL | Status: DC
Start: 2014-08-16 — End: 2014-08-16
  Administered 2014-08-16: 17:00:00 20 mg via ORAL

## 2014-08-16 MED ORDER — METOPROLOL TARTRATE 25 MG PO TABS
25 MG | Freq: Two times a day (BID) | ORAL | Status: DC
Start: 2014-08-16 — End: 2014-08-16
  Administered 2014-08-16: 17:00:00 12.5 mg via ORAL

## 2014-08-16 MED ORDER — FLUTICASONE PROPIONATE 50 MCG/ACT NA SUSP
50 MCG/ACT | Freq: Every day | NASAL | Status: DC
Start: 2014-08-16 — End: 2014-08-16

## 2014-08-16 MED ORDER — PRAZOSIN HCL 1 MG PO CAPS
1 MG | Freq: Every evening | ORAL | Status: DC
Start: 2014-08-16 — End: 2014-08-16

## 2014-08-16 MED ORDER — LOSARTAN POTASSIUM 100 MG PO TABS
100 MG | Freq: Every day | ORAL | Status: DC
Start: 2014-08-16 — End: 2014-08-16
  Administered 2014-08-16: 17:00:00 100 mg via ORAL

## 2014-08-16 MED ORDER — BUPROPION HCL 100 MG PO TABS
100 MG | Freq: Every day | ORAL | Status: DC
Start: 2014-08-16 — End: 2014-08-16
  Administered 2014-08-16: 17:00:00 200 mg via ORAL

## 2014-08-16 MED ORDER — VITAMIN C 500 MG PO TABS
500 MG | Freq: Every day | ORAL | Status: DC
Start: 2014-08-16 — End: 2014-08-16

## 2014-08-16 MED ORDER — CETIRIZINE HCL 10 MG PO TABS
10 MG | Freq: Every day | ORAL | Status: DC
Start: 2014-08-16 — End: 2014-08-16
  Administered 2014-08-16: 17:00:00 10 mg via ORAL

## 2014-08-16 MED ORDER — OXYCODONE-ACETAMINOPHEN 5-325 MG PO TABS
5-325 MG | ORAL | Status: DC | PRN
Start: 2014-08-16 — End: 2014-08-16
  Administered 2014-08-16: 18:00:00 1 via ORAL

## 2014-08-16 MED ORDER — DEXAMETHASONE 4 MG PO TABS
4 MG | Freq: Four times a day (QID) | ORAL | Status: DC
Start: 2014-08-16 — End: 2014-08-16
  Administered 2014-08-16 (×3): 4 mg via ORAL

## 2014-08-16 MED ORDER — ATORVASTATIN CALCIUM 20 MG PO TABS
20 MG | Freq: Every day | ORAL | Status: DC
Start: 2014-08-16 — End: 2014-08-16
  Administered 2014-08-16: 17:00:00 20 mg via ORAL

## 2014-08-16 MED ORDER — PANTOPRAZOLE SODIUM 40 MG PO TBEC
40 MG | Freq: Every day | ORAL | Status: DC
Start: 2014-08-16 — End: 2014-08-16

## 2014-08-16 MED ORDER — FERREX 150 FORTE PLUS 50-100 MG PO CAPS
50-100 MG | Freq: Every day | ORAL | Status: DC
Start: 2014-08-16 — End: 2014-08-16

## 2014-08-16 MED ORDER — HYDROCHLOROTHIAZIDE 25 MG PO TABS
25 MG | ORAL | Status: DC
Start: 2014-08-16 — End: 2014-08-16
  Administered 2014-08-16: 17:00:00 25 mg via ORAL

## 2014-08-16 MED FILL — TRAZODONE HCL 50 MG PO TABS: 50 MG | ORAL | Qty: 1

## 2014-08-16 MED FILL — OXYCODONE-ACETAMINOPHEN 5-325 MG PO TABS: 5-325 MG | ORAL | Qty: 1

## 2014-08-16 MED FILL — PRAZOSIN HCL 1 MG PO CAPS: 1 MG | ORAL | Qty: 2

## 2014-08-16 MED FILL — FLUTICASONE PROPIONATE 50 MCG/ACT NA SUSP: 50 MCG/ACT | NASAL | Qty: 16

## 2014-08-16 MED FILL — DEXAMETHASONE 4 MG PO TABS: 4 MG | ORAL | Qty: 1

## 2014-08-16 MED FILL — CITALOPRAM HYDROBROMIDE 20 MG PO TABS: 20 MG | ORAL | Qty: 1

## 2014-08-16 MED FILL — LOSARTAN POTASSIUM 100 MG PO TABS: 100 MG | ORAL | Qty: 1

## 2014-08-16 MED FILL — CETIRIZINE HCL 10 MG PO TABS: 10 MG | ORAL | Qty: 1

## 2014-08-16 MED FILL — BUPROPION HCL 100 MG PO TABS: 100 MG | ORAL | Qty: 2

## 2014-08-16 MED FILL — ALPRAZOLAM 0.5 MG PO TABS: 0.5 MG | ORAL | Qty: 1

## 2014-08-16 MED FILL — LORAZEPAM 2 MG/ML IJ SOLN: 2 MG/ML | INTRAMUSCULAR | Qty: 1

## 2014-08-16 MED FILL — HYDROCHLOROTHIAZIDE 25 MG PO TABS: 25 MG | ORAL | Qty: 1

## 2014-08-16 MED FILL — METOPROLOL TARTRATE 25 MG PO TABS: 25 MG | ORAL | Qty: 1

## 2014-08-16 MED FILL — DEXAMETHASONE SODIUM PHOSPHATE 4 MG/ML IJ SOLN: 4 MG/ML | INTRAMUSCULAR | Qty: 1

## 2014-08-16 MED FILL — VITAMIN C 500 MG PO TABS: 500 MG | ORAL | Qty: 1

## 2014-08-16 MED FILL — ATORVASTATIN CALCIUM 20 MG PO TABS: 20 MG | ORAL | Qty: 1

## 2014-08-16 MED FILL — FERREX 150 FORTE PLUS 50-100 MG PO CAPS: 50-100 MG | ORAL | Qty: 1

## 2014-08-16 NOTE — Progress Notes (Signed)
Tolerated solid food without difficulty swallowing

## 2014-08-16 NOTE — Progress Notes (Signed)
IV removed by pt. Writer attempted two IV's with vein finder. House sup, Elnita MaxwellCheryl attempted with Vein finder. Ultra sound will be attempted by day house Sup

## 2014-08-16 NOTE — Discharge Instructions (Signed)
Up as tolerated No heavy lifting

## 2014-08-16 NOTE — Progress Notes (Signed)
AVSS, he is able to swallow ice chips and water without problems. There is no aspiration with liquids. Neurologically he is intact. He can be d/c'd home later today to follow up with Dr Mickey FarberSpetka at his regular scheduled appointment .

## 2014-08-16 NOTE — Discharge Instructions - Pharmacy (Addendum)
No blood thinner meds for one week  Pain meds can constipate May take over the counter stool softeners or laxatives  No driving while on pain meds or until comfortable with neck movement

## 2014-08-16 NOTE — Progress Notes (Signed)
Type and Reason for Visit: F/U    Dx:  Dysphagia, s/p ACDF    Labs:  POC Glucose=142, 146    Subjective:  Pt sleeping @ time of visit.  But spoke to Pt's Wife, and Jasmine DecemberSharon (Nursing).    Dietitian Assessment of Nutrition Re-Screen:  Advanced to PO diet:  clear liquids @ breakfast, full liquids @ lunch & then to current 2,000 kcal carb controlled diet.  Reportedly able to swallow better, and is tolerating clear/full liquids per Physicians.      Medically stable for D/C'd to home today.    Electronically signed by Jake SamplesHERYL  Aliyanah Rozas, MSEd, RDN, LD on 08/16/14 at 4:42 PM    Contact Number: [phone]

## 2014-08-16 NOTE — Plan of Care (Signed)
Problem: Discharge Planning:  Goal: Participates in care planning  Participates in care planning   Outcome: Ongoing  Goal: Discharged to appropriate level of care  Discharged to appropriate level of care   Outcome: Ongoing    Problem: Activity Intolerance:  Goal: Ability to tolerate increased activity will improve  Ability to tolerate increased activity will improve   Outcome: Ongoing    Problem: Fluid Volume - Deficit:  Goal: Absence of fluid volume deficit signs and symptoms  Absence of fluid volume deficit signs and symptoms   Outcome: Ongoing    Problem: Mobility - Impaired:  Goal: Mobility will improve to maximum level  Mobility will improve to maximum level   Outcome: Ongoing    Problem: Nutrition Deficit:  Goal: Ability to achieve adequate nutritional intake will improve  Ability to achieve adequate nutritional intake will improve   Outcome: Ongoing    Problem: Pain:  Goal: Pain level will decrease  Pain level will decrease   Outcome: Ongoing  Goal: Ability to notify healthcare provider of pain before it becomes unmanageable or unbearable will improve  Ability to notify healthcare provider of pain before it becomes unmanageable or unbearable will improve   Outcome: Ongoing  Goal: Control of acute pain  Control of acute pain   Outcome: Ongoing  Goal: Control of chronic pain  Control of chronic pain   Outcome: Ongoing    Problem: Serum Glucose Level - Abnormal:  Goal: Ability to maintain appropriate glucose levels will improve to within specified parameters  Ability to maintain appropriate glucose levels will improve to within specified parameters   Outcome: Ongoing    Problem: Falls - Risk of  Goal: Absence of falls  Outcome: Ongoing  Siderails up x 2  Hourly rounding.  Call light in reach.  Instructed to call for assist before attempting out of bed.  Remains free from falls and accidental injury at this time.   Floor free from obstacles, and bed is locked and in lowest position. Adequate lighting provided.      Bed alarm on

## 2014-08-16 NOTE — Progress Notes (Signed)
[facility] - Progress Note    08/16/2014   11:49 AM    Name:  Joshua Dorsey  MRN:    [id number]     Acct:     [id number]   Room:  [id number]  IP Day: 0     Admit Date: 08/14/2014  8:38 AM  PCP: Otilio Saber, MD    Subjective:       Interval History: Status: improved. Able to swallow better. Tolerated liquids today and decadron pill.    ROS:  Constitutional: negative for chills and fevers  Respiratory: negative for cough and shortness of breath  Cardiovascular: negative for chest pain, orthopnea and palpitations  Gastrointestinal: negative for abdominal pain, diarrhea and nausea  Musculoskeletal:positive for back pain and neck pain, negative for arthralgias  Neurological: negative for dizziness and headaches    Medications:     Allergies:   Allergies   Allergen Reactions   ??? Oxycontin [Oxycodone Hcl]      Altered mental status       Current Meds:   dexamethasone (DECADRON) tablet 4 mg 4 times per day   oxyCODONE-acetaminophen (PERCOCET) 5-325 MG per tablet 1 tablet Q4H PRN   ALPRAZolam (XANAX) tablet 0.5 mg Q12H PRN   metoprolol tartrate (LOPRESSOR) injection 5 mg Q6H PRN   hydrALAZINE (APRESOLINE) injection 10 mg Q6H PRN   lubrifresh P.M. (artificial tears) ophthalmic ointment Q2H PRN   sodium chloride flush 0.9 % injection 10 mL 2 times per day   sodium chloride flush 0.9 % injection 10 mL PRN   ondansetron (ZOFRAN) injection 4 mg Q6H PRN   HYDROmorphone (DILAUDID) injection 0.5 mg Q3H PRN   glucose (GLUTOSE) 40 % oral gel 15 g PRN   dextrose 50 % solution 12.5 g PRN   glucagon (rDNA) injection 1 mg PRN   dextrose 5 % solution PRN   insulin lispro (HUMALOG) injection vial 0-6 Units Q6H       Data:     Code Status:  Full Code    Vitals:   Visit Vitals   ??? BP 157/77   ??? Pulse 57   ??? Temp 98.5 ??F (36.9 ??C) (Oral)   ??? Resp 18   ??? Ht  (1.727 m)   ??? Wt 207 lb 6 oz (94.1 kg)   ??? SpO2 95%   ??? BMI 31.53 kg/m2     Temp (24hrs), Avg:98.1 ??F (36.7 ??C), Min:97.6 ??F (36.4 ??C), Max:98.6 ??F (37 ??C)    Recent Labs       08/15/14   1134  08/15/14   1637  08/16/14   0003  08/16/14   0618   POCGLU  134*  131*  146*  142*       I/O (24Hr):  Intake/Output Summary (Last 24 hours) at 08/16/14 1149  Last data filed at 08/16/14 0800   Gross per 24 hour   Intake   2809 ml   Output    375 ml   Net   2434 ml     Labs:    Hematology:  Recent Labs      08/14/14   0931   WBC  10.0   HGB  12.6*   HCT  38.3*   PLT  137     Chemistry:  Recent Labs      08/14/14   0931   NA  141   K  4.0   CL  99   CO2  32*   GLUCOSE  107*  BUN  16   CREATININE  0.86   CALCIUM  8.8     No results for input(s): PROT, LABALBU, LABA1C, T3TOTAL, T4TOTAL, FT4, TSH, AST, ALT, LDH, GGT, ALKPHOS, BILITOT, BILIDIR, AMMONIA, AMYLASE, LIPASE, LACTATE, CHOL, HDL, LDLCHOLESTEROL, CHOLHDLRATIO, TRIG, VLDL, BNP, TROPONINI, CKTOTAL, CKMB, CKMBINDEX in the last 72 hours.    Physical Examination:      Visit Vitals   ??? BP 157/77   ??? Pulse 57   ??? Temp 98.5 ??F (36.9 ??C) (Oral)   ??? Resp 18   ??? Ht 5\' 8"  (1.727 m)   ??? Wt 207 lb 6 oz (94.1 kg)   ??? SpO2 95%   ??? BMI 31.53 kg/m2     General appearance: alert, appears stated age and cooperative  Head: Normocephalic, without obvious abnormality, atraumatic  Neck: no adenopathy, no JVD, supple, symmetrical, trachea midline and thyroid not enlarged, symmetric, no tenderness/mass/nodules  Lungs: clear to auscultation bilaterally  Heart: regular rate and rhythm, S1, S2 normal, no murmur, click, rub or gallop  Abdomen: soft, non-tender; bowel sounds normal; no masses,  no organomegaly  Extremities: extremities normal, atraumatic, no cyanosis or edema  Neurologic: Grossly normal    Assessment:     Primary Problem  Esophageal dysphagia       Active Hospital Problems    Diagnosis Date Noted   ??? Prediabetes [R73.09] 08/15/2014   ??? Essential hypertension [I10]    ??? Hyperlipidemia [E78.5]    ??? Esophageal dysphagia [R13.14] 08/14/2014     Past Medical History   Diagnosis Date   ??? Arthritis    ??? Claustrophobia    ??? Depression    ??? Diabetes mellitus  (HCC)      borderline   ??? GERD (gastroesophageal reflux disease)    ??? Hyperlipidemia    ??? Hypertension    ??? OSA on CPAP      nightly   ??? PTSD (post-traumatic stress disorder)    ??? RLS (restless legs syndrome)       Plan:     1. Continue present Rx.  2. Resume home medications.  3. Advance diet per NS.  4. Medically stable for discharge.      Electronically signed by Leanora IvanoffStanley J Laken Lobato, DO on 08/16/2014 at 11:49 AM

## 2014-08-16 NOTE — Discharge Instructions (Signed)
Good nutrition is important when healing from an illness, injury, or surgery. Follow any nutrition recommendations given to you during the hospital stay. If you are instructed to take an oral nutrition supplement at home, you can take it with meals, in-between meals, and/or before bedtime. These supplements can be purchased at most local grocery stores, pharmacies, and chain super-stores. If you need help in covering the cost of your medication or oral supplement, visit [email or web address]. If you have any questions about your diet or nutrition, call the hospital and ask for the dietitian.       Your nutrition orders at the time of discharge were:  Soft Drink plenty of fluids to prevent dehydration

## 2014-08-16 NOTE — Discharge Summary (Signed)
Admitting Dx/Principal Dx: dysphagia s/p ACDF  Surgeon: Dr. Mordecai MaesZakeri  Procedure: none  Pt was admitted to med/surg floor. During their stay they received IVF, pain control, wound care, and nursing care. There were no complications during their stay. On day of discharge their VSS, OOB to halls,  drinking well, urinating normally.  Pt was discharged  without complications during stay.  Pt was sent home on Percocet and Zanaflex, and medrol dose pack.  Any blood thinners were stopped for 1 week then resumed.  F/U with Dr.Spetka in office at his regularly scheduled post-op appointment

## 2014-08-16 NOTE — Plan of Care (Signed)
Problem: Nutrition Deficit:  Goal: Ability to achieve adequate nutritional intake will improve  Ability to achieve adequate nutritional intake will improve   Outcome: Ongoing  Tolerating clear liquids and diet being advanced to full at lunch    Problem: Pain:  Goal: Pain level will decrease  Pain level will decrease   Outcome: Ongoing  Pain decreasing Not as hard to swallow Denies need for pain med    Problem: Falls - Risk of  Goal: Absence of falls  Outcome: Ongoing  Fall precautions in place Bed in low position and locked Using call light appropriately Alert and oriented Gait steady when up

## 2014-08-17 LAB — POC GLUCOSE FINGERSTICK
POC Glucose: 116 mg/dL — ABNORMAL HIGH (ref 75–110)
POC Glucose: 162 mg/dL — ABNORMAL HIGH (ref 75–110)

## 2014-12-29 IMAGING — DX UPPER GI WITH CONTRAST
6 series · 6 of 6 positions shown · IV contrast (agent unspecified)
Comparison: None

UPPER GI WITH CONTRAST 12/29/2014 [DATE]:
CLINICAL INDICATION: Nausea after eating

[AP (1 of 3)]
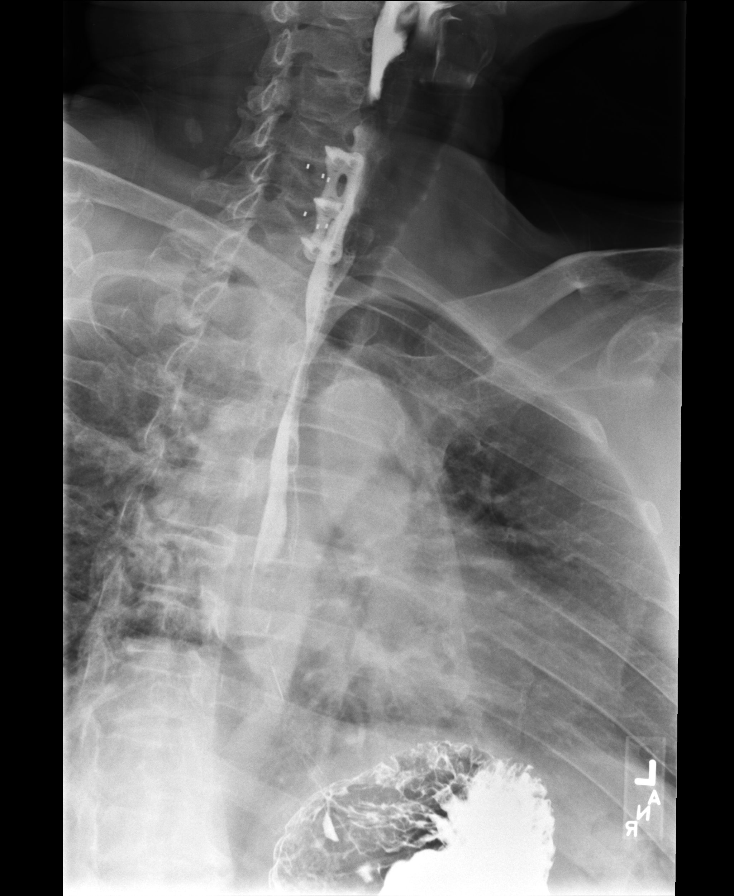

[lateral]
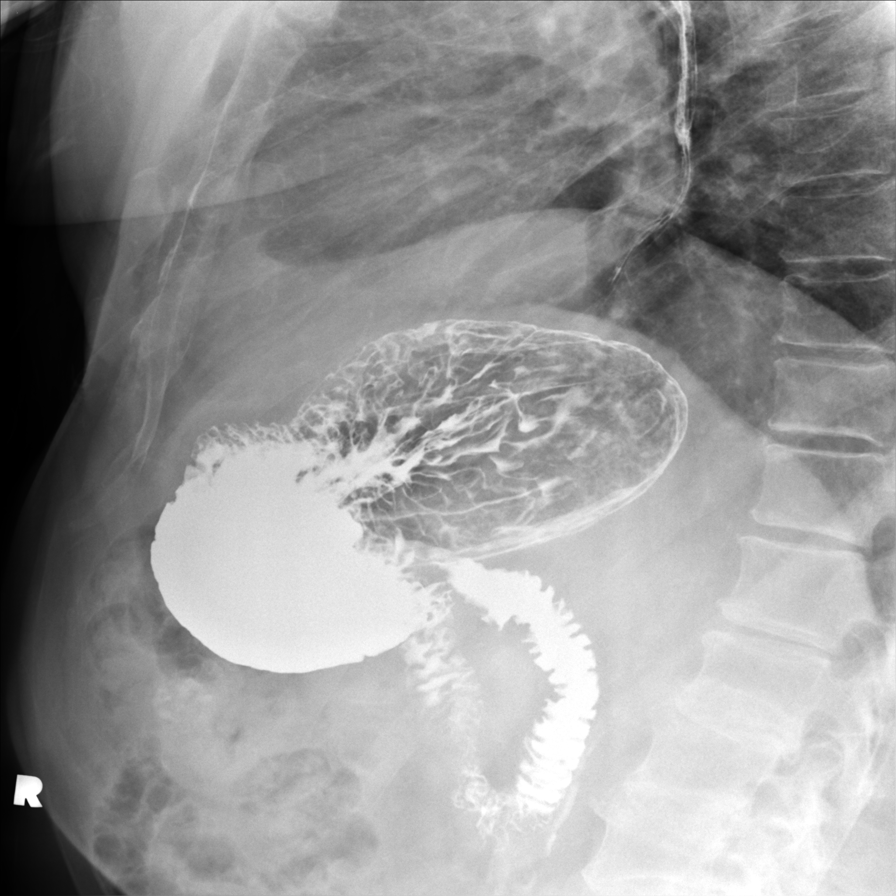

[lpo]
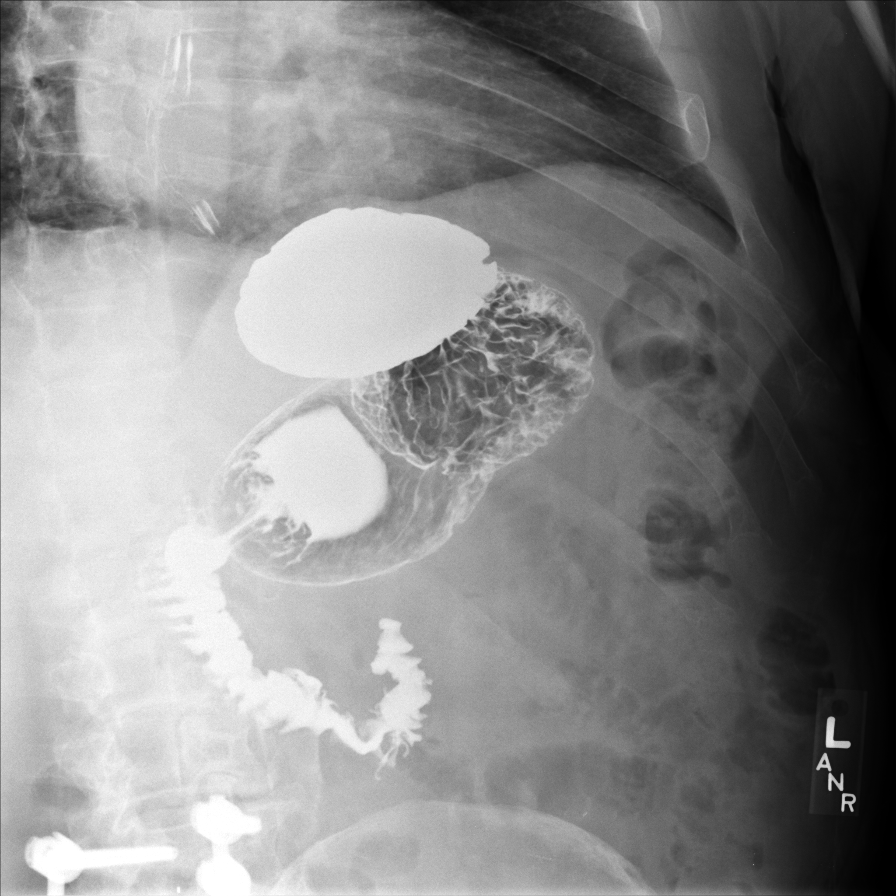

[PA]
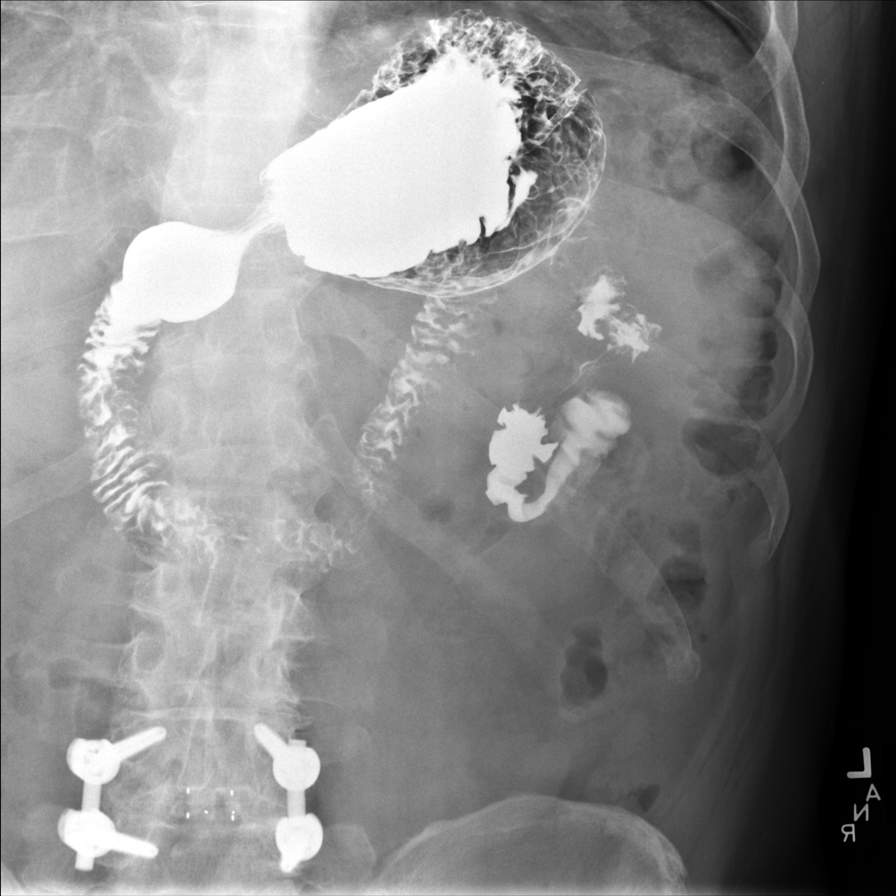

[AP (2 of 3)]
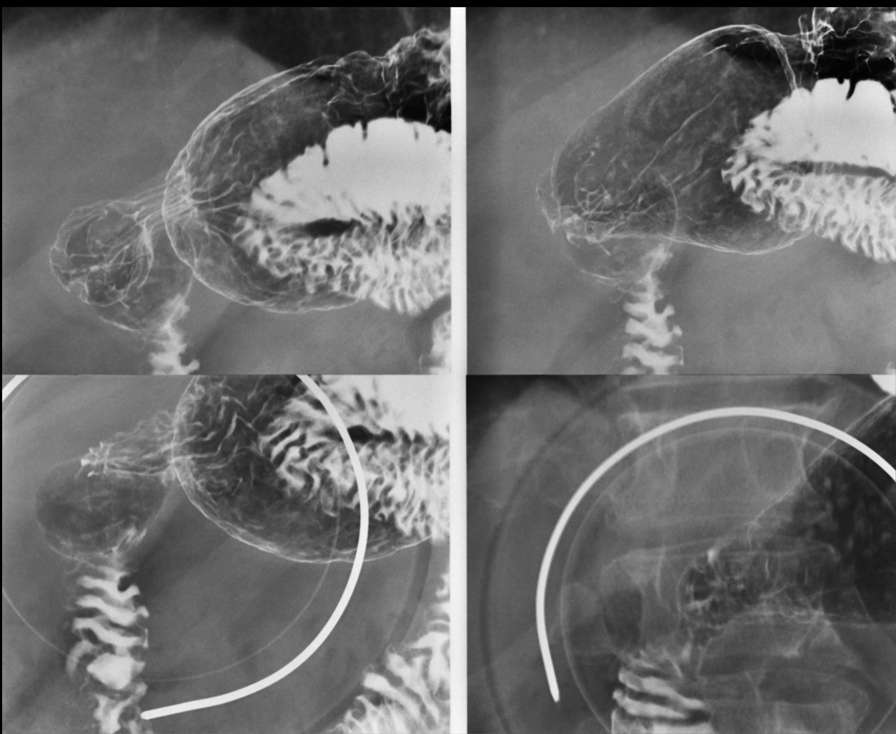

[AP (3 of 3)]
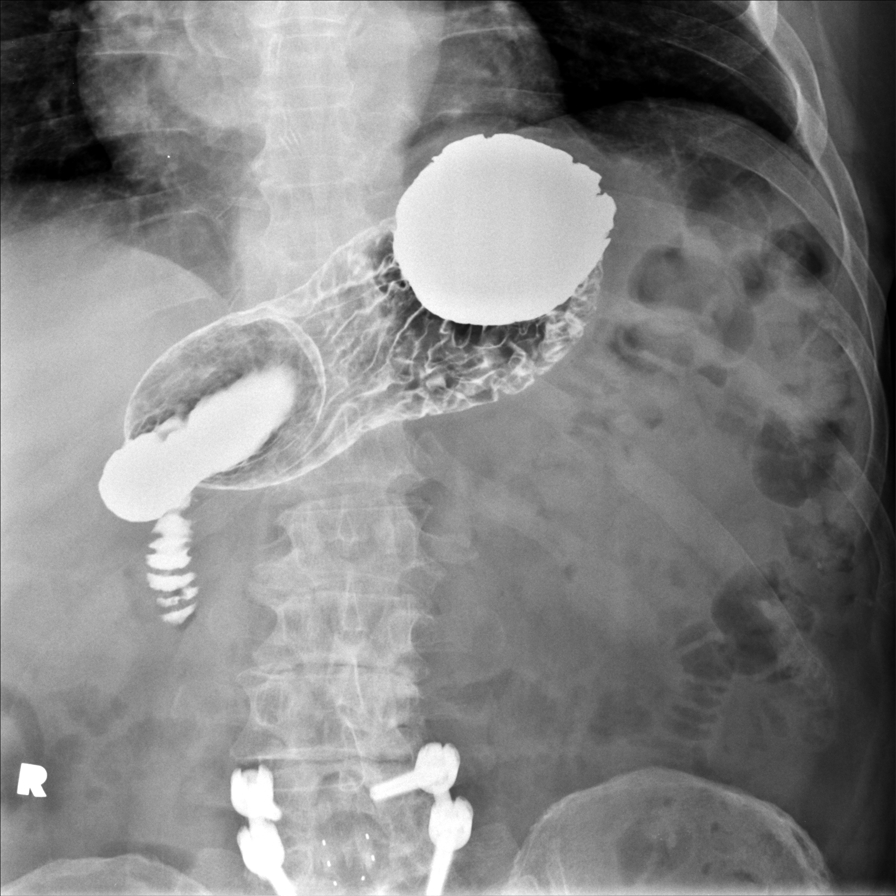

[6 of 6 positions shown; findings below may reference images not displayed]

FINDINGS: Stomach is unremarkable in form and function. There is no evidence
for
mass, conspicuous wall thickening or ulceration. The duodenal bulb shows
normal
pliability. Barium passes freely into the duodenum and proximal jejunum.
IMPRESSION: Unremarkable upper GI. No evidence for mass, ulceration or abnormal motility.

## 2020-12-16 ENCOUNTER — Emergency Department: Admit: 2020-12-16 | Payer: MEDICARE

## 2020-12-16 ENCOUNTER — Inpatient Hospital Stay
Admit: 2020-12-16 | Discharge: 2020-12-16 | Disposition: A | Discharge status: Home / Self Care | Payer: MEDICARE | Attending: Emergency Medicine

## 2020-12-16 DIAGNOSIS — R2242 Localized swelling, mass and lump, left lower limb: Secondary | ICD-10-CM

## 2020-12-16 NOTE — ED Provider Notes (Signed)
[facility]  [address] [place] [address]  Phone: [phone]        Pt Name: Joshua Dorsey  MRN: [id number]  Birthdate January 20, 1946  Date of evaluation: 12/16/20    CHIEFCOMPLAINT       Chief Complaint   Patient presents with    Foot Pain     Painful lump on top of left foot x 3 months.         HISTORY OF PRESENT ILLNESS (Location/Symptom, Timing/Onset, Context/Setting, Quality, Duration, Modifying Factors, Severity)      Joshua Dorsey is a 75 y.o. male with no pertinent PMH who presents to the ED via private auto with bump on left foot ongoing for the last 3 months.  Patient denies any known trauma but states he has had a bump on the top of his left foot that has grown in size and is becoming painful.  He has been using a walking boot last few days due to the pain.  He denies any numbness or tingling or drainage from the bump.  He has not taken medications for symptoms and states that walking makes her symptoms worse.  He denies any past surgical history to the left foot or ankle.    PAST MEDICAL / SURGICAL / SOCIAL / FAMILY HISTORY     PMH:  has a past medical history of Arthritis, Claustrophobia, Depression, Diabetes mellitus (HCC), GERD (gastroesophageal reflux disease), Hyperlipidemia, Hypertension, OSA on CPAP, PTSD (post-traumatic stress disorder), and RLS (restless legs syndrome).  Surgical History:  has a past surgical history that includes Cholecystectomy; back surgery (2009); Tonsillectomy; and cervical fusion (08/12/2014).  Social History:  reports that he quit smoking about 34 years ago. He does not have any smokeless tobacco history on file. He reports current alcohol use of about 2.0 standard drinks per week. He reports that he does not use drugs.  Family History: He indicated that his mother is deceased. He indicated that his father is deceased.   family history includes Coronary Art Dis in his father; Dementia in his mother.  Psychiatric History: None    Allergies: Oxycontin  [oxycodone hcl]    Home Medications:   Prior to Admission medications    Medication Sig Start Date End Date Taking? Authorizing Provider   oxyCODONE-acetaminophen (PERCOCET) 5-325 MG per tablet 1-2 tablets PO every 4-6 hours PRN pain 08/13/14   Darden Palmer, PA   omeprazole (PRILOSEC) 20 MG capsule Take 20 mg by mouth Daily    Historical Provider, MD   buPROPion (WELLBUTRIN) 100 MG tablet Take 200 mg by mouth daily    Historical Provider, MD   hydrochlorothiazide (HYDRODIURIL) 25 MG tablet Take 25 mg by mouth every other day    Historical Provider, MD   metoprolol (LOPRESSOR) 25 MG tablet Take 12.5 mg by mouth 2 times daily    Historical Provider, MD   losartan (COZAAR) 100 MG tablet Take 100 mg by mouth daily    Historical Provider, MD   citalopram (CELEXA) 20 MG tablet Take 20 mg by mouth daily    Historical Provider, MD   atorvastatin (LIPITOR) 20 MG tablet Take 20 mg by mouth daily    Historical Provider, MD   tamsulosin (FLOMAX) 0.4 MG capsule Take 0.4 mg by mouth daily    Historical Provider, MD   fluticasone (FLONASE) 50 MCG/ACT nasal spray 2 sprays by Nasal route daily    Historical Provider, MD   prazosin (MINIPRESS) 2 MG capsule Take 2 mg  by mouth nightly    Historical Provider, MD   traZODone (DESYREL) 150 MG tablet Take 150 mg by mouth nightly    Historical Provider, MD   loratadine (CLARITIN) 10 MG tablet Take 10 mg by mouth daily    Historical Provider, MD   metFORMIN (GLUCOPHAGE) 500 MG tablet Take 250 mg by mouth daily (with breakfast)    Historical Provider, MD   Probiotic Product (PROBIOTIC COLON SUPPORT PO) Take by mouth daily    Historical Provider, MD   iron polysaccaride (FERREX 150 FORTE PLUS) 50-100 MG CAPS Take 1 capsule by mouth daily    Historical Provider, MD   ascorbic acid (VITAMIN C) 500 MG tablet Take 500 mg by mouth daily RLS    Historical Provider, MD       REVIEW OF SYSTEMS  (2-9 systems for level 4, 10 ormore for level 5)      Review of Systems   Constitutional:  Negative for  chills and fever.   Musculoskeletal:         Positive left foot pain and swelling.   Neurological:  Negative for weakness and numbness.       All other systems negative except as marked.     PHYSICAL EXAM  (up to 7 for level 4, 8 or more for level 5)      INITIAL VITALS:  blood pressure is 121/70 and his pulse is 58. His respiration is 16 and oxygen saturation is 95%.      Vital signs reviewed.    Physical Exam  Constitutional:       General: He is not in acute distress.     Appearance: Normal appearance. He is not ill-appearing or toxic-appearing.   HENT:      Head: Normocephalic and atraumatic.   Neck:      Trachea: No tracheal deviation.   Cardiovascular:      Pulses:           Dorsalis pedis pulses are 2+ on the right side and 2+ on the left side.        Posterior tibial pulses are 2+ on the right side and 2+ on the left side.   Musculoskeletal:      Cervical back: Neck supple.        Feet:    Skin:     General: Skin is warm and dry.      Capillary Refill: Capillary refill takes less than 2 seconds.   Neurological:      Mental Status: He is alert.   Psychiatric:         Mood and Affect: Mood normal.         Behavior: Behavior normal.         DIFFERENTIAL DIAGNOSIS / MDM     After I feel exam, I do have suspicion for cyst to the patient's foot, however I will obtain x-ray to rule out any bony abnormalities x-ray shows no acute osseous or soft tissue abnormality.  Discussed results with patient and recommend he follows up with podiatry.  Patient states he is going to [place] next week for the winter but states he does have a podiatrist to follow-up there.  We will provide him with podiatry locally if his plan change.  Instructed to take Tylenol and ibuprofen as needed for pain.  Directed return with any worsening symptoms.  Patient is agreement this plan at this time.  All question concerns were answered at this time.    The patient  presented with foot pain. A fracture was not detected on X-ray. The ankle and knee  joints were not affected. No skin lesions were seen. There are no signs of compartment syndrome. The pulses are 2/4. The motor is 5/5. The sensation is intact.The patient was advised to return to the Emergency Department for increasing pain, numbness, weakness, or coldness to the extremity.  The patient was further instructed to follow up in 2-3 days with their family doctor or orthopedics.  The patient voiced understanding of these instructions.    The patient understands that at this time there is no evidence for a more malignant underlying process, but the patient also understands that early in the process of an illness or injury, an emergency department workup can be falsely reassuring.  Routine discharge counseling was given, and the patient understands that worsening, changing or persistent symptoms should prompt an immediate call or follow up with their primary physician or return to the emergency department. The importance of appropriate follow up was also discussed.  I have reviewed the disposition diagnosis with the patient and or their family/guardian.  I have answered their questions and given discharge instructions.  They voiced understanding of these instructions and did not have any further questions or complaints.      PLAN (LABS / IMAGING / EKG):  Orders Placed This Encounter   Procedures    XR FOOT LEFT (MIN 3 VIEWS)       MEDICATIONS ORDERED:  No orders of the defined types were placed in this encounter.      Controlled Substances Monitoring:     DIAGNOSTIC RESULTS     EKG: All EKG's are interpreted by the Emergency Department Physician who either signs or Co-signs this chart in the absenceof a cardiologist.        RADIOLOGY: All images are read by the radiologist and their interpretations are reviewed.    XR FOOT LEFT (MIN 3 VIEWS)   Final Result   No acute osseous or soft tissue abnormality.             No results found.    LABS:  No results found for this visit on 12/16/20.    EMERGENCY  DEPARTMENT COURSE           Vitals:    Vitals:    12/16/20 1153   BP: 121/70   Pulse: 58   Resp: 16   SpO2: 95%     -------------------------  BP: 121/70,  , Heart Rate: 58, Resp: 16      RE-EVALUATION:  See ED Course notes above.        CONSULTS:  None    PROCEDURES:  None    FINAL IMPRESSION      1. Mass of foot, left          DISPOSITION / PLAN     CONDITION ON DISPOSITION:   Stable for discharge.     PATIENT REFERRED TO:  Otilio Saber, MD  [address]  [address]  [place] [address]  [phone]    Call in 1 day      [facility]  [address]  [place] [address]  [phone]    If symptoms worsen    Mellody Drown, DPM  [facility]  [address]  [place] [address]  [phone]    Call in 1 day      DISCHARGE MEDICATIONS:  Discharge Medication List as of 12/16/2020  12:57 PM          Ihor Austin Kaila Devries, APRN - CNP   Emergency Medicine Nurse Practitioner    (Please note that portions of this note were completed with a voice recognition program.  Efforts were made to edit the dictations but occasionally words aremis-transcribed.)       Ellin Saba, APRN - CNP  12/16/20 1321

## 2020-12-16 NOTE — Discharge Instructions (Signed)
Please understand that at this time there is no evidence for a more serious underlying process, but that early in the process of an illness or injury, an emergency department workup can be falsely reassuring.  You should contact your family doctor within the next 48 hours for a follow up appointment    THANK YOU!!!    From [facility] and [facility]    On behalf of the Emergency Department staff at [facility], I would like to thank you for giving us the opportunity to address your health care needs and concerns.    We hope that during your visit, our service was delivered in a professional and caring manner. Please keep [facility] in mind as we walk with you down the path to your own personal wellness.     Please expect an automated text message or email from us so we can ask a few questions about your health and progress. Based on your answers, a clinician may call you back to offer help and instructions.    Please understand that early in the process of an illness or injury, an emergency department workup can be falsely reassuring.  If you notice any worsening, changing or persistent symptoms please call your family doctor or return to the ER immediately.     Tell us how we did during your visit at [email or web address]   and let us know about your experience

## 2020-12-16 NOTE — ED Provider Notes (Signed)
[facility] ED  eMERGENCY dEPARTMENT eNCOUnter   Independent Attestation     Pt Name: Joshua Dorsey  MRN: [id number]  Birthdate 20-Dec-1945  Date of evaluation: 12/16/20       Joshua Dorsey is a 75 y.o. male who presents with Foot Pain (Painful lump on top of left foot x 3 months.  )      Vitals:   Vitals:    12/16/20 1153   BP: 121/70   Pulse: 58   Resp: 16   SpO2: 95%       Impression:   1. Mass of foot, left          I was personally available for consultation in the Emergency Department. I have reviewed the chart and agree with the documentation as recorded by the [facility], including the assessment, treatment plan and disposition.    Lorelee New, MD  Attending Emergency  Physician       Chalmers Guest, MD  12/16/20 [phone]

## 2020-12-16 NOTE — ED Triage Notes (Signed)
Patient arrived to unit with spouse, complaints of painful lump on top of left foot x 3 months.  Ambulatory.  Gait steady.

## 2022-03-09 IMAGING — MR MRI CERVICAL SPINE W/WO CONTRAST
5 of 12 series · 22 of 48 positions shown · IV contrast (Gadolinium)
Comparison: MRI cervical spine November 07, 2021 is not currently available..

________________________________________________________________________________________________ 
MRI CERVICAL SPINE W/WO CONTRAST, 03/09/2022 [DATE]: 
CLINICAL INDICATION: Cervical radiculopathy. Neck pain radiates into both 
shoulders. Cervical fusion 20 years ago..
TECHNIQUE: Multiplanar, multiecho position MR images of the cervical spine were 
performed without and with 9 mL of Gadavist were injected intravenously by hand. 
1 mL of Gadavist were discarded. Patient was scanned on a 1.5T magnet.

[Series 202: T2 · sagittal · 4.0mm · 0.62mm/px · 3 of 11 slices shown (1 of 2)]
[im 1/11]
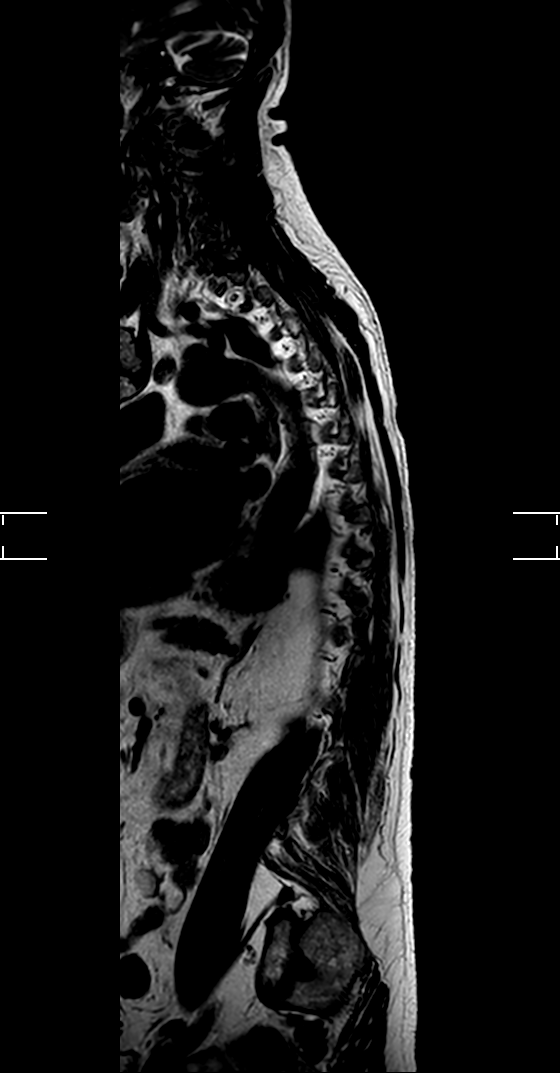
[im 6/11]
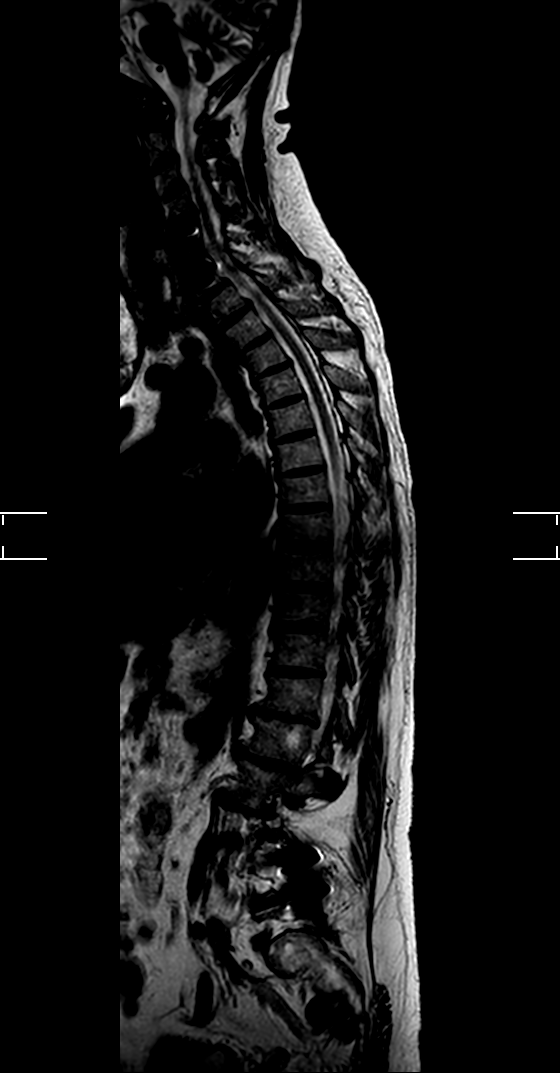
[im 11/11]
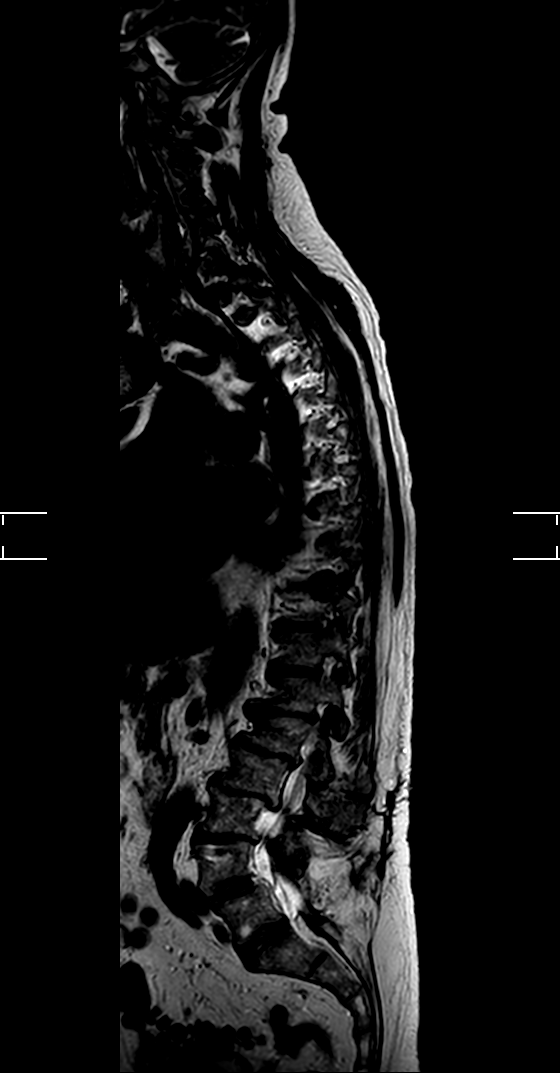

[Series 501: T1 · sagittal · 3.0mm · 0.39mm/px · 3 of 17 slices shown (1 of 2)]
[im 1/17]
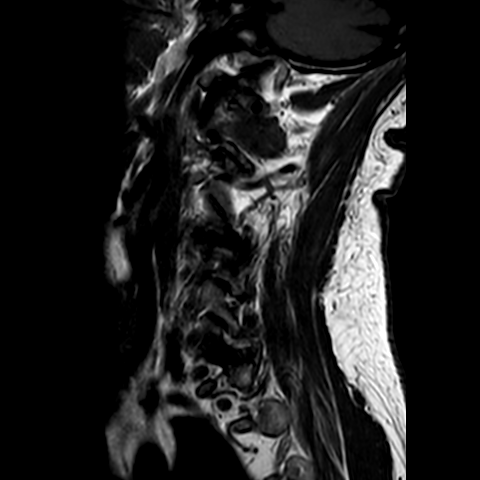
[im 9/17]
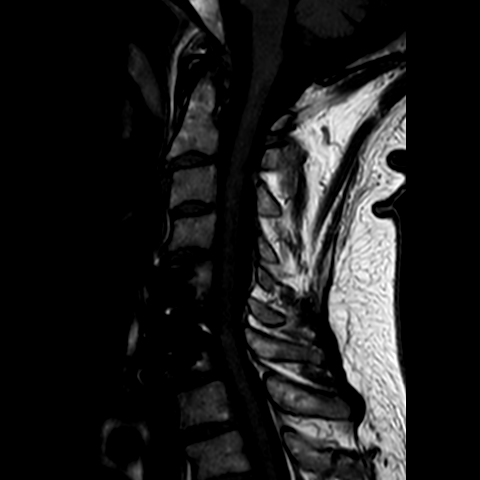
[im 17/17]
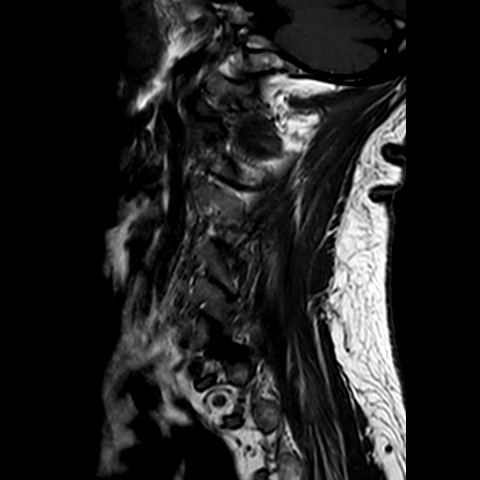

[Series 801: T2 · axial · 3.0mm · 0.31mm/px · z∈[-234,-140]mm · 6 of 32 slices shown (2 of 2)]
[im 1/32]
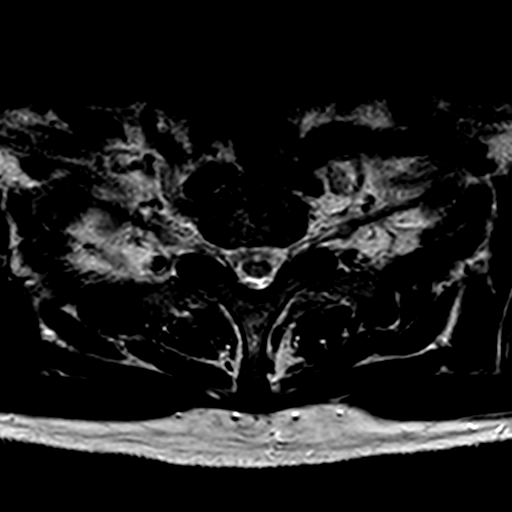
[im 7/32]
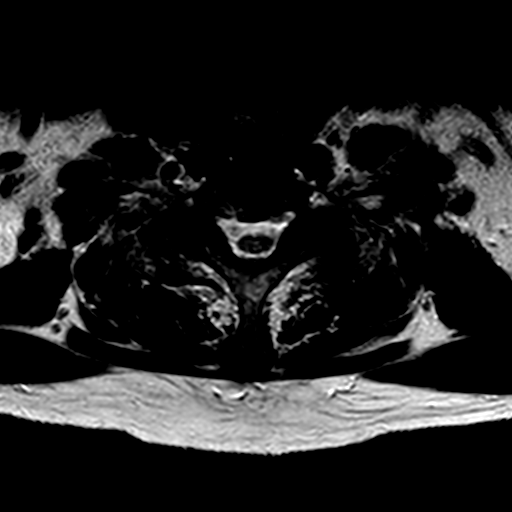
[im 13/32]
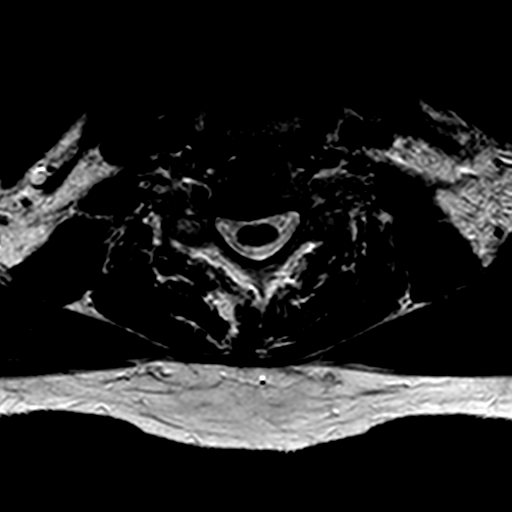
[im 19/32]
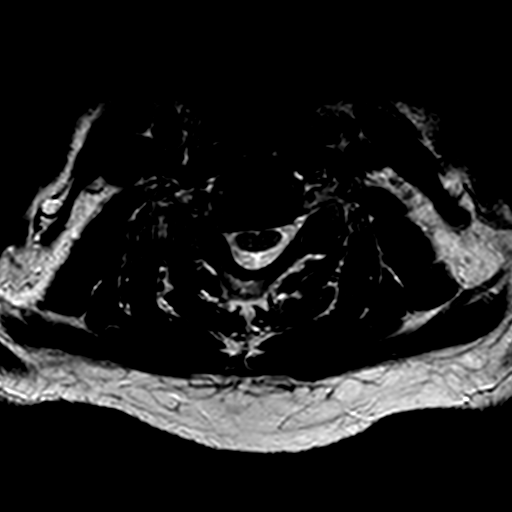
[im 25/32]
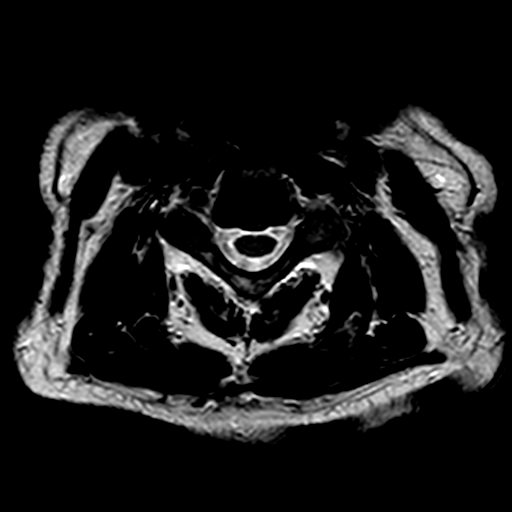
[im 32/32]
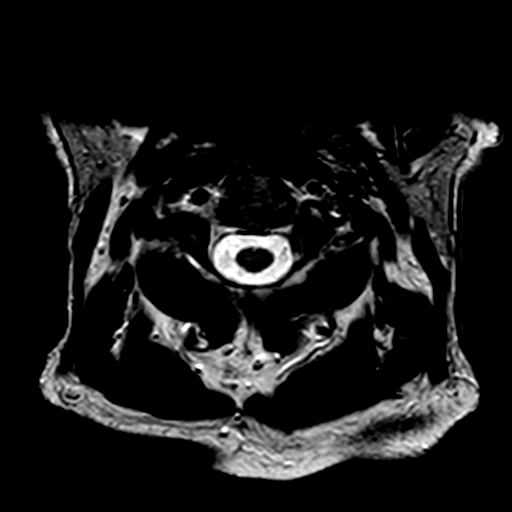

[Series 901: T1 · axial · 3.0mm · 0.59mm/px · z∈[-232,-138]mm · 6 of 32 slices shown (2 of 2)]
[im 1/32]
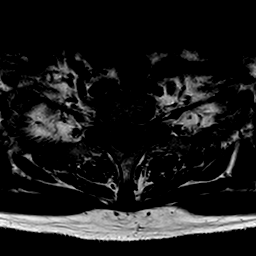
[im 7/32]
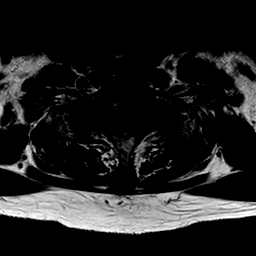
[im 13/32]
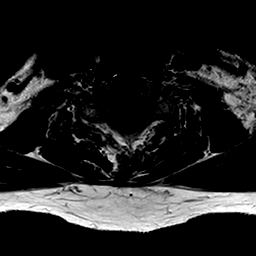
[im 19/32]
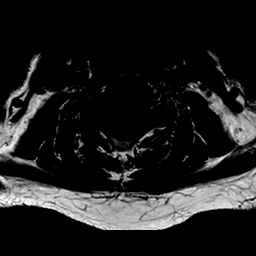
[im 25/32]
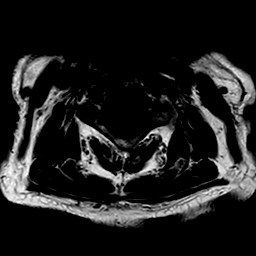
[im 32/32]
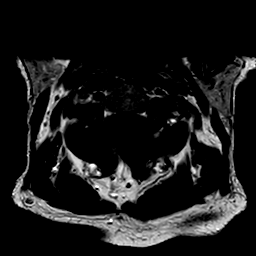

[Series 2001: T1 post-contrast · axial · 3.0mm · 0.59mm/px · z∈[-237,-185]mm · 4 of 35 slices shown]
[im 1/35]
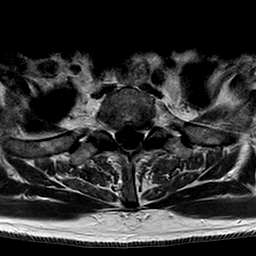
[im 6/35]
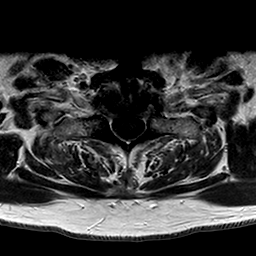
[im 12/35]
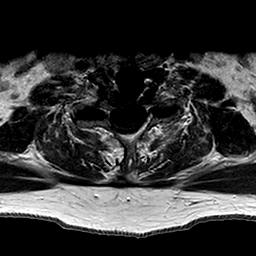
[im 18/35]
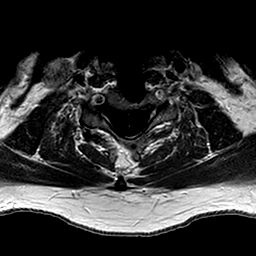

[22 of 48 positions shown; findings below may reference images not displayed]

FINDINGS: Evidence of previous ACDF C5-C7. Scout localizer also demonstrates previous 
lower lumbar fusion. 
-------------------------------------------------------------------------------- 
----------------- 
GENERAL: 
ALIGNMENT: Normal coronal alignment. Slight loss of the normal cervical lordosis 
with trace grade 1 anterolisthesis C7 on T1. 
VERTEBRAL BODY HEIGHT: Normal.  
MARROW SIGNAL: No focal suspect signal abnormality. 
CORD SIGNAL: Normal.  No pathologic enhancement. 
ADDITIONAL FINDINGS: None. 
-------------------------------------------------------------------------------- 
---------------- 
SEGMENTAL:  
CRANIOCERVICAL JUNCTION: No significant stenosis. 
C2-C3: Normal disc height. Patent canal and left foramen. Mild-to-moderate right 
foraminal narrowing with right greater than left facet arthropathy. 
C3-C4: Normal disc height. Small central disc osteophytic protrusion. Canal 
patent. Bilateral uncinate spurring with mild to moderate bilateral foraminal 
narrowing. Normal facets. 
C4-C5: Normal disc height. Minimal generalized disc osteophyte complex abuts and 
minimally indents the ventral cord margin. Canal remains patent. Mild left and 
moderate to severe right foraminal narrowing. Right-sided facet arthropathy. 
C5-C6: Fusion. Canal patent. Mild to moderate right and mild left foraminal 
narrowing. Normal facets. 
C6-C7: Fusion. Canal patent. Left foramen patent. Significant right-sided 
uncinate spurring results in moderate to moderately severe right foraminal 
narrowing. Mild facet arthropathy. 
C7-T1: Normal disc height. Patent canal and foramina. Mild facet arthropathy. 
Visualized upper thoracic segments demonstrate mild degenerative changes. 
-------------------------------------------------------------------------------- 
---------------
IMPRESSION: C5-C7 ACDF. 
No significant central canal narrowing. 
C4-C5, moderate to severe right foraminal narrowing. 
C5-C6, mild to moderate right foraminal narrowing. 
C6-C7, moderate to moderately severe right foraminal narrowing. 
Less significant degenerative changes.

## 2022-03-09 IMAGING — MR MRI LUMBAR SPINE W/WO CONTRAST
7 of 13 series · 15 of 48 positions shown · IV contrast (gadavist)
Comparison: MRI lumbar spine April 10, 2019.

________________________________________________________________________________________________ 
MRI LUMBAR SPINE W/WO CONTRAST, 03/09/2022 [DATE]: 
CLINICAL INDICATION: Previous lumbar spine surgery. Evaluate cauda equina 
syndrome. Lumbar radiculopathy.
TECHNIQUE: Multiplanar, multiecho position MR images of the lumbar spine were 
performed without and with 9 mL of Gadavist were injected intravenously by hand. 
1 mL of Gadavist were discarded. Patient was scanned on a 1.5T magnet.

[Series 202: T2 · sagittal · 4.0mm · 0.62mm/px · 2 of 11 slices shown (1 of 2)]
[im 1/11]
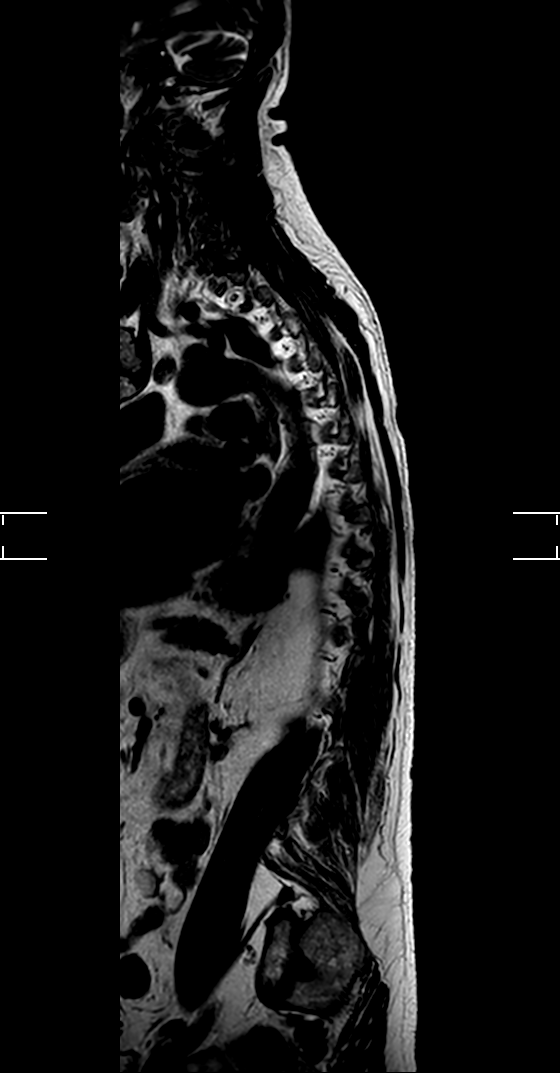
[im 11/11]
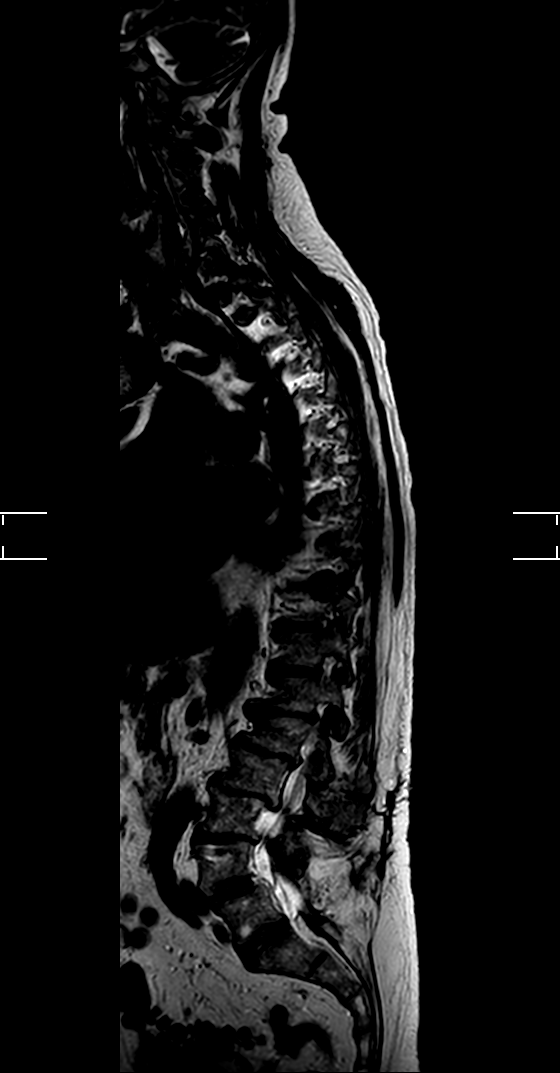

[Series 1001: survey · axial · 10.0mm · 1.21mm/px · 1 of 10 slices shown]
[im 1/10]
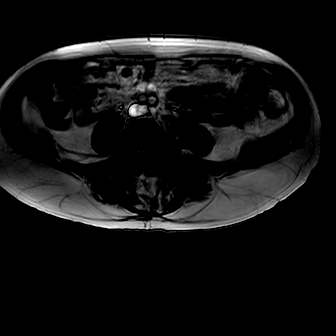

[Series 1101: t2w_cor-surv · coronal · 6.0mm · 0.60mm/px · 1 of 14 slices shown]
[im 1/14]
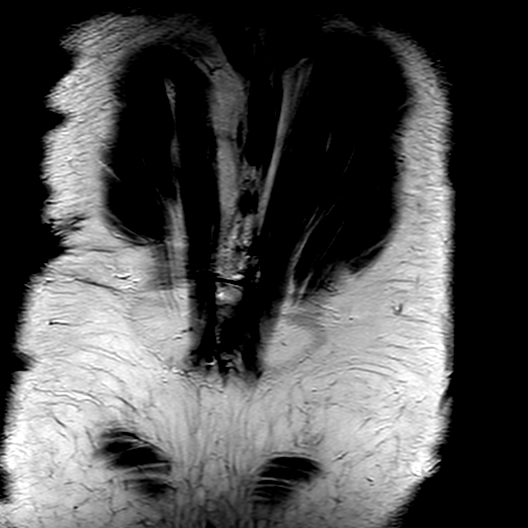

[Series 1201: T1 · sagittal · 4.0mm · 0.46mm/px · 2 of 19 slices shown (1 of 2)]
[im 1/19]
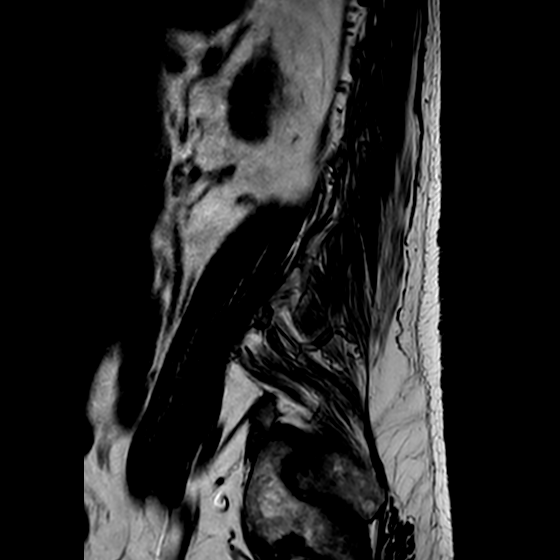
[im 19/19]
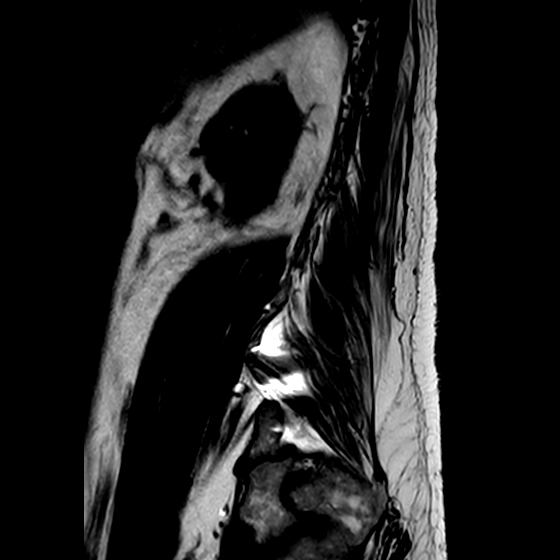

[Series 1501: T2 · oblique · 4.0mm · 0.30mm/px · 3 of 30 slices shown (2 of 2)]
[im 1/30]
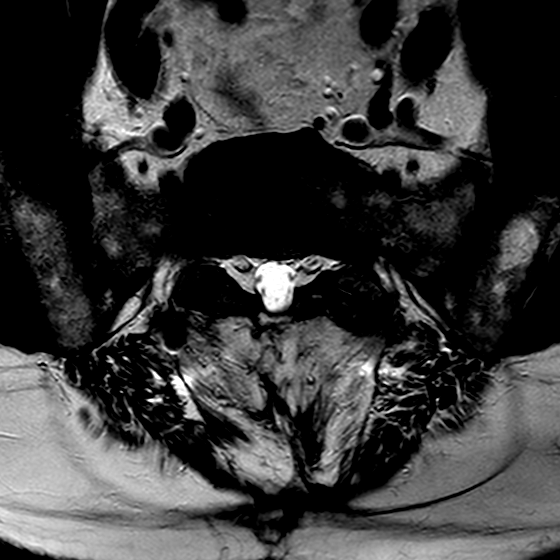
[im 15/30]
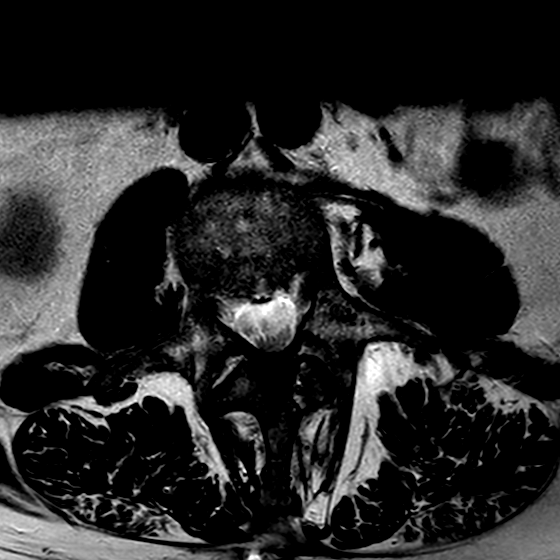
[im 30/30]
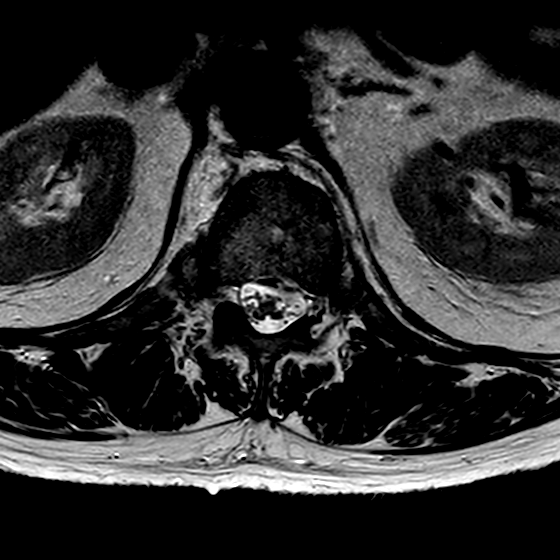

[Series 1601: T1 · oblique · 4.0mm · 0.33mm/px · 3 of 30 slices shown (2 of 2)]
[im 1/30]
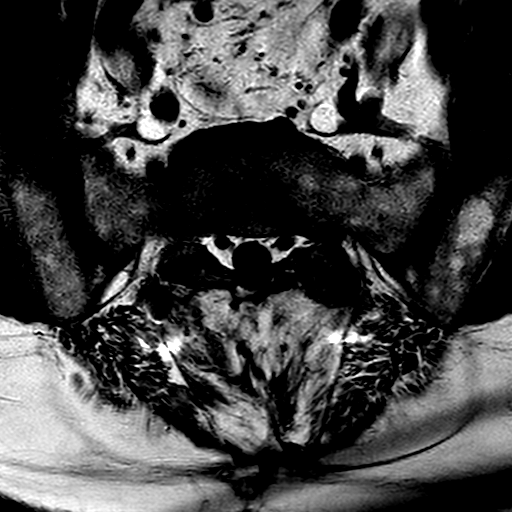
[im 15/30]
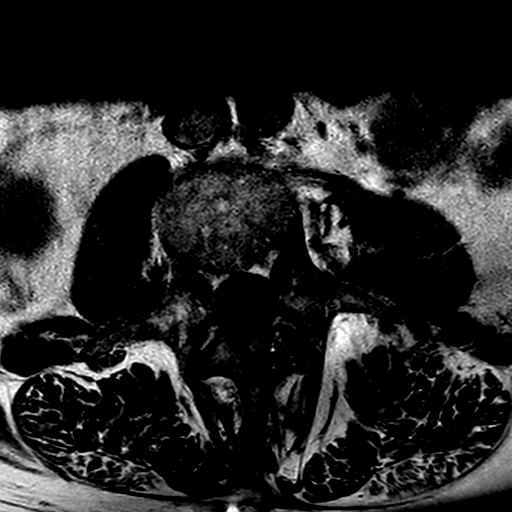
[im 30/30]
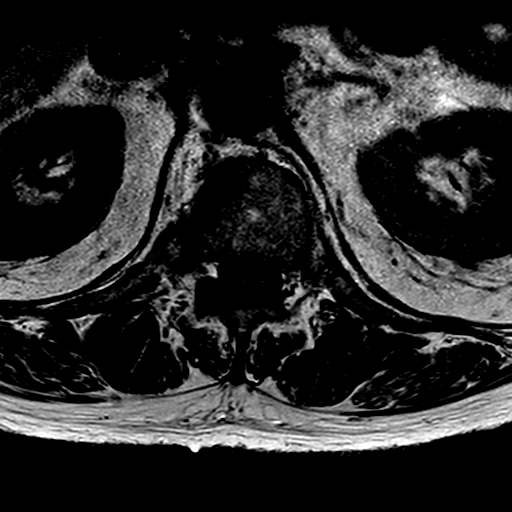

[Series 1801: T1 post-contrast · oblique · 4.0mm · 0.33mm/px · 3 of 30 slices shown]
[im 1/30]
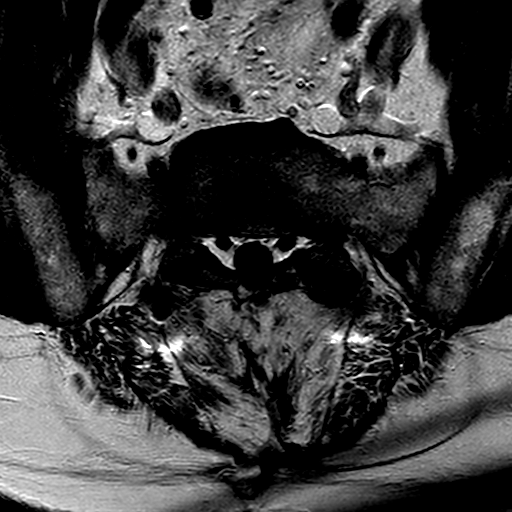
[im 15/30]
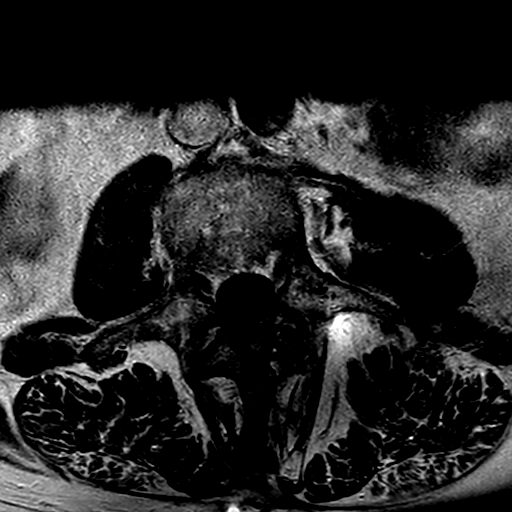
[im 30/30]
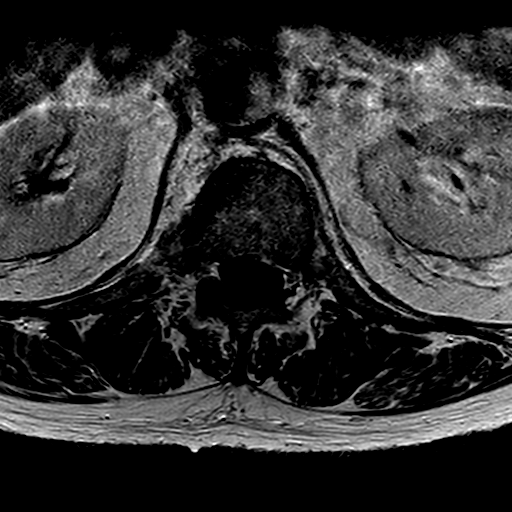

[15 of 48 positions shown; findings below may reference images not displayed]

FINDINGS: Previous posterior spinal fusion with bilateral pedicle screws and longitudinal 
connecting rods L4-L5. Anterior interbody graft material L4-L5. Previous 
laminectomy L4-L5. This extends inferiorly to the L5-S1 level. 
-------------------------------------------------------------------------------- 
------ 
GENERAL: 
Nomenclature is based on 5 lumbar type vertebral bodies.     
ALIGNMENT: Mild to moderate dextroconvex lumbar scoliosis. There is 5 mm left 
lateral translation of L2 upon L3. Stepwise grade 1 retrolisthesis L1 on L2 and 
L2 on L3 and L3 on L4. There is grade 1 anterolisthesis L4 on L5. 
VERTEBRAL BODY HEIGHT: Normal.  
MARROW SIGNAL: No focal suspect signal abnormality. 
CORD SIGNAL: Normal distal spinal cord and cauda equina. Conus medullaris 
terminates at L1. 
ADDITIONAL FINDINGS: No abnormal enhancement except for mild enhancement within 
the laminectomy bed, likely granulation tissue. 
Modic I-II: L2-L3 
Ligamentum Flavum > 2.5 mm: All except the laminectomy sites. 
-------------------------------------------------------------------------------- 
------ 
SEGMENTAL: 
T12-L1: Moderate loss of disc height. Loss of disc signal. Anterior marginal 
osteophytes. Mild annular bulge with mild canal stenosis. Posterior osteophytic 
ridging. This is most prominent in the central zone. Mild facet arthropathy. 
Foramina patent. Stable. 
L1-L2: Moderate loss of disc height. Loss of disc signal. Mild canal stenosis 
with slight narrowing of the lateral recesses bilaterally. Small bilateral facet 
joint effusions. Mild to moderate right foraminal narrowing. Left foramen is 
patent. Stable. 
L2-L3: Moderate loss of disc height. Loss of disc signal. Retrolisthesis with 
disc uncovering. Mild to moderate canal stenosis. Facet arthropathy with right 
facet joint effusion. Lateral recess narrowing bilaterally. Mild right foraminal 
narrowing. Left foramen is moderately to severely narrowed. Stable. 
L3-L4: Moderate loss of disc height posteriorly. Retrolisthesis with disc 
unroofing. Mild canal stenosis. Facet arthropathy with ligamentum flavum 
hypertrophy. Moderate left and mild right foraminal narrowing. Stable. 
L4-L5: Fusion. Canal patent. Foramina patent. There are elongated. Facets fused. 
Stable. 
L5-S1: Loss of disc signal. Canal and foramina are patent. Mild facet 
arthropathy. Stable. 
-------------------------------------------------------------------------------- 
------
IMPRESSION: Anterior posterior spinal fusion L4-L5 with laminectomy L4-L5 through L5-S1. 
Stable multilevel lumbar degenerative changes. Most significant canal stenosis 
is at L2-L3, mild to moderate. 
Moderate to severe left foraminal narrowing L2-L3. 
Moderate left foraminal narrowing L3-L4. 
Less significant degenerative changes.

## 2023-07-17 ENCOUNTER — Inpatient Hospital Stay
Admit: 2023-07-17 | Discharge: 2023-07-17 | Disposition: A | Discharge status: Home / Self Care | Payer: MEDICARE | Attending: Emergency Medicine

## 2023-07-17 ENCOUNTER — Emergency Department: Admit: 2023-07-17 | Payer: MEDICARE

## 2023-07-17 DIAGNOSIS — M25512 Pain in left shoulder: Secondary | ICD-10-CM

## 2023-07-17 MED ORDER — PREDNISONE 20 MG PO TABS
20 | ORAL_TABLET | Freq: Every day | ORAL | 0 refills | 5.00 days | Status: AC
Start: 2023-07-17 — End: 2023-07-22

## 2023-07-17 MED ORDER — KETOROLAC TROMETHAMINE 30 MG/ML IJ SOLN
30 | Freq: Once | INTRAMUSCULAR | Status: AC
Start: 2023-07-17 — End: 2023-07-17
  Administered 2023-07-17: 16:00:00 30 mg via INTRAMUSCULAR

## 2023-07-17 MED FILL — KETOROLAC TROMETHAMINE 30 MG/ML IJ SOLN: 30 mg/mL | INTRAMUSCULAR | Qty: 1 | Fill #0

## 2023-07-17 NOTE — ED Provider Notes (Signed)
 [facility] Emergency Department  [address]  [place] [address]  Phone: [phone]        [facility] EMERGENCY DEPARTMENT  EMERGENCY DEPARTMENT ENCOUNTER      Pt Name: Joshua Dorsey  MRN: [id number]  Birthdate [birth year]  Date of evaluation: 07/17/2023  Provider: Bartholomew Light, DO    CHIEF COMPLAINT       Chief Complaint   Patient presents with    Shoulder Pain     Has been going on for two weeks, aggravated his left shoulder more when he prevented a fall the other day and extended his left arm onto the ground.          HISTORY OF PRESENT ILLNESS   (Location/Symptom, Timing/Onset,Context/Setting, Quality, Duration, Modifying Factors, Severity)  Note limiting factors.   Joshua Dorsey is a 78 y.o. male who presents to the emergency department for the evaluation of left shoulder pain.  This has been going on for couple of weeks.  He aggravated it when he was moving things around and extending his arm.  Has not really gotten better despite the fact that his wife has been rubbing some Voltaren on it and they have been trying ice.  No numbness or weakness in the left arm.  Hurts worse with movement.  No other injury or complaint    Nursing Notes were reviewed.    REVIEW OF SYSTEMS    (2-9systems for level 4, 10 or more for level 5)     Review of Systems   Constitutional:  Negative for fever.   HENT:  Negative for sore throat.    Respiratory:  Negative for shortness of breath.    Cardiovascular:  Negative for chest pain.   Gastrointestinal:  Negative for diarrhea and vomiting.   Genitourinary:  Negative for dysuria.   Musculoskeletal:         Left shoulder pain   Skin:  Negative for rash.   Neurological:  Negative for weakness.   All other systems reviewed and are negative.      Except asnoted above the remainder of the review of systems was reviewed and negative.       PAST MEDICAL HISTORY     Past Medical History:   Diagnosis Date    Arthritis     Claustrophobia     Depression     Diabetes mellitus  (HCC)     borderline    GERD (gastroesophageal reflux disease)     Hyperlipidemia     Hypertension     OSA on CPAP     nightly    PTSD (post-traumatic stress disorder)     RLS (restless legs syndrome)          SURGICAL HISTORY       Past Surgical History:   Procedure Laterality Date    BACK SURGERY  2009    lumbar fusion    CERVICAL FUSION  08/12/2014    ACDF C5-6, C6-7    CHOLECYSTECTOMY      TONSILLECTOMY           CURRENT MEDICATIONS     Previous Medications    AMITRIPTYLINE (ELAVIL) 10 MG TABLET    Take 2 tablets by mouth nightly    ASCORBIC ACID (VITAMIN C ) 500 MG TABLET    Take 500 mg by mouth daily RLS    ATORVASTATIN  (LIPITOR ) 20 MG TABLET    Take 0.5 tablets by mouth daily    DOXAZOSIN (CARDURA) 8 MG TABLET  Take 2 tablets by mouth nightly    FEXOFENADINE (ALLEGRA) 180 MG TABLET    Take 1 tablet by mouth daily    FINASTERIDE (PROSCAR) 5 MG TABLET    Take 1 tablet by mouth daily    FLUTICASONE  (FLONASE ) 50 MCG/ACT NASAL SPRAY    2 sprays by Nasal route daily    GABAPENTIN (NEURONTIN) 300 MG CAPSULE    Take 2 capsules by mouth 3 times daily.    LORAZEPAM  (ATIVAN ) 0.5 MG TABLET    Take 1 tablet by mouth nightly as needed for Anxiety.    LOSARTAN  (COZAAR ) 100 MG TABLET    Take 1 tablet by mouth daily    MEMANTINE (NAMENDA) 10 MG TABLET    Take 1 tablet by mouth 2 times daily    OXYBUTYNIN (DITROPAN) 5 MG TABLET    Take 1 tablet by mouth 2 times daily    OXYCODONE -ACETAMINOPHEN  (PERCOCET ) 5-325 MG PER TABLET    1-2 tablets PO every 4-6 hours PRN pain    PANTOPRAZOLE  (PROTONIX ) 40 MG TABLET    Take 1 tablet by mouth daily    PROBIOTIC PRODUCT (PROBIOTIC COLON SUPPORT PO)    Take by mouth daily    RIVASTIGMINE (EXELON) 13.3 MG/24HR    Place 1 patch onto the skin daily    TRAZODONE  (DESYREL ) 150 MG TABLET    Take 100 mg by mouth nightly    VENLAFAXINE (EFFEXOR XR) 150 MG EXTENDED RELEASE CAPSULE    Take 1 capsule by mouth daily       ALLERGIES     Oxycontin  [oxycodone  hcl]    FAMILY HISTORY       Family History    Problem Relation Age of Onset    Dementia Mother     Coronary Art Dis Father         MI while dancing          SOCIAL HISTORY       Social History     Socioeconomic History    Marital status: Married   Tobacco Use    Smoking status: Former     Current packs/day: 0.00     Types: Cigarettes     Quit date: 08/02/1986     Years since quitting: 36.9   Substance and Sexual Activity    Alcohol use: Yes     Alcohol/week: 2.0 standard drinks of alcohol     Types: 2 Cans of beer per week    Drug use: No       SCREENINGS    Glasgow Coma Scale  Eye Opening: Spontaneous  Best Verbal Response: Oriented  Best Motor Response: Obeys commands  Glasgow Coma Scale Score: 15        PHYSICAL EXAM    (up to 7 for level 4, 8 or more for level 5)     ED Triage Vitals [07/17/23 1158]   BP Systolic BP Percentile Diastolic BP Percentile Temp Temp Source Pulse Respirations SpO2   (!) 133/54 -- -- 98 F (36.7 C) Oral 50 18 96 %      Height Weight - Scale         1.626 m (5\' 4" ) 85.7 kg (189 lb)             Physical Exam  Vitals and nursing note reviewed.   Constitutional:       General: He is not in acute distress.     Appearance: He is not ill-appearing or toxic-appearing.   HENT:  Head: Normocephalic and atraumatic.   Eyes:      Conjunctiva/sclera: Conjunctivae normal.   Cardiovascular:      Rate and Rhythm: Normal rate.      Pulses: Normal pulses.   Pulmonary:      Effort: Pulmonary effort is normal. No respiratory distress.      Breath sounds: No wheezing.   Abdominal:      General: There is no distension.   Musculoskeletal:         General: Tenderness present. No deformity or signs of injury.      Comments: Tenderness on the left anterior glenohumeral joint.  Good distal pulses in both upper extremities.  Range of motion limited a little bit by pain.  No deformity.  No bruising   Skin:     General: Skin is warm and dry.   Neurological:      General: No focal deficit present.      Mental Status: He is alert.      Gait: Gait normal.       Comments: No Facial asymmetry, speaking normal.          EMERGENCY DEPARTMENT COURSE and DIFFERENTIAL DIAGNOSIS/MDM:   Vitals:    Vitals:    07/17/23 1158   BP: (!) 133/54   Pulse: 50   Resp: 18   Temp: 98 F (36.7 C)   TempSrc: Oral   SpO2: 96%   Weight: 85.7 kg (189 lb)   Height: 1.626 m (5\' 4" )       Patient presents to the emergency department with a complaint described above.  Vitals are grossly normal.  He is nontoxic, resting comfortably, no distress.  Neurovascularly intact.  Will obtain x-ray, give Toradol and reassess      DIAGNOSTIC RESULTS     LABS:  Labs Reviewed - No data to display    All other labs were within normal range or not returned as of this dictation.    RADIOLOGY:  XR SHOULDER LEFT (MIN 2 VIEWS)   Final Result   1. No acute fracture or dislocation.   2. Mild degenerative change of the left AC joint.               ED Course as of 07/17/23 1302   Wed Jul 17, 2023   1301 No acute fracture or dislocation.  Mild degenerative changes of the [facility] joint noted.    At this time the patient is without objective evidence of an acute process requiring hospitalization or inpatient management. They have remained hemodynamically stable and are stable for discharge with outpatient follow-up.     Standard anticipatory guidance given to patient upon discharge.  Have given them a specific time frame in which to follow-up and who to follow-up with.  I have also advised them that they should return to the emergency department if they get worse, or not getting better or develop any new or concerning symptoms.  Patient demonstrates understanding.   [TS]      ED Course User Index  [TS] Bartholomew Light, DO         PROCEDURES:  Unless otherwise noted below, none     Procedures    FINAL IMPRESSION      1. Acute pain of left shoulder          DISPOSITION/PLAN   DISPOSITION Decision To Discharge 07/17/2023 01:01:23 PM   DISPOSITION CONDITION Stable           PATIENT REFERRED TO:  [facility]  orthopedics  [facility]  Orthopedics and Sports Medicine  [address] [place]  [place] [address]  [phone]  Call in 3 days        DISCHARGE MEDICATIONS:  New Prescriptions    PREDNISONE (DELTASONE) 20 MG TABLET    Take 2 tablets by mouth daily for 5 days          (Please note that portions of this note were completed with a voice recognition program.  Efforts were made to edit the dictations but occasionally words are mis-transcribed.)    Bartholomew Light, DO,(electronically signed)  Board Certified Emergency Physician          Bartholomew Light, DO  07/17/23 1302

## 2023-07-17 NOTE — Discharge Instructions (Signed)
 Please make an appointment to follow up with your primary doctor and/or the specialist as we discussed. Take all medications as prescribed.  Return to ER if condition worsens or you develop any new/concerning symptoms as we discussed.
